# Patient Record
Sex: Male | Born: 1950
Health system: Southern US, Community
[De-identification: ages and names within clinical notes are randomized; demographics above are authoritative.]

## PROBLEM LIST (undated history)

## (undated) DIAGNOSIS — E7800 Pure hypercholesterolemia, unspecified: Secondary | ICD-10-CM

## (undated) DIAGNOSIS — E119 Type 2 diabetes mellitus without complications: Secondary | ICD-10-CM

## (undated) DIAGNOSIS — E1169 Type 2 diabetes mellitus with other specified complication: Secondary | ICD-10-CM

## (undated) DIAGNOSIS — E785 Hyperlipidemia, unspecified: Secondary | ICD-10-CM

## (undated) HISTORY — DX: Pure hypercholesterolemia, unspecified: E78.00

## (undated) HISTORY — DX: Type 2 diabetes mellitus with other specified complication: E11.69

## (undated) HISTORY — PX: EYE SURGERY: SHX253

## (undated) HISTORY — PX: OTHER SURGICAL HISTORY: SHX169

## (undated) HISTORY — PX: KNEE ARTHROSCOPY: SHX127

## (undated) HISTORY — DX: Type 2 diabetes mellitus with other specified complication: E78.5

---

## 2007-06-04 ENCOUNTER — Ambulatory Visit: Payer: Self-pay | Admitting: Specialist

## 2007-07-08 ENCOUNTER — Ambulatory Visit: Payer: Self-pay | Admitting: Specialist

## 2009-12-28 ENCOUNTER — Ambulatory Visit: Payer: Self-pay

## 2009-12-31 ENCOUNTER — Ambulatory Visit: Payer: Self-pay | Admitting: Unknown Physician Specialty

## 2010-04-12 ENCOUNTER — Encounter (INDEPENDENT_AMBULATORY_CARE_PROVIDER_SITE_OTHER): Payer: Self-pay | Admitting: *Deleted

## 2010-05-06 ENCOUNTER — Encounter (INDEPENDENT_AMBULATORY_CARE_PROVIDER_SITE_OTHER): Payer: Self-pay | Admitting: *Deleted

## 2010-05-11 ENCOUNTER — Ambulatory Visit: Payer: Self-pay | Admitting: Gastroenterology

## 2010-05-17 ENCOUNTER — Ambulatory Visit: Payer: Self-pay | Admitting: Unknown Physician Specialty

## 2010-05-18 ENCOUNTER — Ambulatory Visit: Payer: Self-pay | Admitting: Gastroenterology

## 2010-05-20 HISTORY — PX: INTRAOCULAR LENS INSERTION: SHX110

## 2010-12-20 NOTE — Procedures (Signed)
Summary: Colonoscopy  Patient: Hunter Peters Note: All result statuses are Final unless otherwise noted.  Tests: (1) Colonoscopy (COL)   COL Colonoscopy           DONE     [facility]     [address]     [place], [place]  [address]           COLONOSCOPY PROCEDURE REPORT           PATIENT:  Hunter Peters, Hunter Peters  MR#:  [id number]     BIRTHDATE:  05/17/1951, 59 yrs. old  GENDER:  male     ENDOSCOPIST:  Vania Rea. Jarold Motto, MD, [facility]     REF. BY:     PROCEDURE DATE:  05/18/2010     PROCEDURE:  Average-risk screening colonoscopy     G0121     ASA CLASS:  Class I     INDICATIONS:  surveillance     MEDICATIONS:   Fentanyl 75 mcg IV, Versed 9 mg IV           DESCRIPTION OF PROCEDURE:   After the risks benefits and     alternatives of the procedure were thoroughly explained, informed     consent was obtained.  Digital rectal exam was performed and     revealed no abnormalities.   The Pentax Colonoscope [id number]     endoscope was introduced through the anus and advanced to the     cecum, which was identified by both the appendix and ileocecal     valve, without limitations.  The quality of the prep was     excellent, using MoviPrep.  The instrument was then slowly     withdrawn as the colon was fully examined.     <<PROCEDUREIMAGES>>           FINDINGS:  Mild diverticulosis was found in the sigmoid to     descending colon segments.  No polyps or cancers were seen.  This     was otherwise a normal examination of the colon. PERIANAL     PAPILLAE.SEE PICTURES.   Retroflexed views in the rectum revealed     hypertrophied anal papillae.    The scope was then withdrawn from     the patient and the procedure completed.           COMPLICATIONS:  None     ENDOSCOPIC IMPRESSION:     1) Mild diverticulosis in the sigmoid to descending colon     segments     2) No polyps or cancers     3) Otherwise normal examination     4) Hypertrophied anal papillae     RECOMMENDATIONS:     1) high fiber  diet     2) Continue current colorectal screening recommendations for     "routine risk" patients with a repeat colonoscopy in 10 years.     REPEAT EXAM:  No           ______________________________     Vania Rea. Jarold Motto, MD, Clement[facility] Graham           CC:  Vonita Moss, MD           n.     Rosalie DoctorMarland Kitchen   Vania Rea. Patterson at 05/18/2010 11:01 AM           Tish Men, [id number]  Note: An exclamation Braylan (!) indicates a result that was not dispersed into the flowsheet. Document Creation Date: 05/18/2010 11:01 AM _______________________________________________________________________  (1) Order  result status: Final Collection or observation date-time: 05/18/2010 10:54 Requested date-time:  Receipt date-time:  Reported date-time:  Referring Physician:   Ordering Physician: Sheryn Bison [phone]) Specimen Source:  Source: Launa Grill Order Number: [id number] Lab site:   Appended Document: Colonoscopy    Clinical Lists Changes  Observations: Added new observation of COLONNXTDUE: 04/2020 (05/18/2010 12:26)

## 2010-12-20 NOTE — Letter (Signed)
Summary: Previsit letter  [facility]  [address] [place], [place] [address]   Phone: [phone]  Fax: [phone]       04/12/2010 MRN: [id number]  [facility] [address] [place], [place]  [address]  Dear Mr. HOPKIN,  Welcome to the Gastroenterology Division at [facility].    You are scheduled to see a nurse for your pre-procedure visit on 05-11-10 at 1:30P.M. on the 3rd floor at [facility], [address]  We ask that you try to arrive at our office 15 minutes prior to your appointment time to allow for check-in.  Your nurse visit will consist of discussing your medical and surgical history, your immediate family medical history, and your medications.    Please bring a complete list of all your medications or, if you prefer, bring the medication bottles and we will list them.  We will need to be aware of both prescribed and over the counter drugs.  We will need to know exact dosage information as well.  If you are on blood thinners (Coumadin, Plavix, Aggrenox, Ticlid, etc.) please call our office today/prior to your appointment, as we need to consult with your physician about holding your medication.   Please be prepared to read and sign documents such as consent forms, a financial agreement, and acknowledgement forms.  If necessary, and with your consent, a friend or relative is welcome to sit-in on the nurse visit with you.  Please bring your insurance card so that we may make a copy of it.  If your insurance requires a referral to see a specialist, please bring your referral form from your primary care physician.  No co-pay is required for this nurse visit.     If you cannot keep your appointment, please call [phone] to cancel or reschedule prior to your appointment date.  This allows [place] the opportunity to schedule an appointment for another patient in need of care.    Thank you for choosing [facility] for your medical  needs.  We appreciate the opportunity to care for you.  Please visit [place] at our website  to learn more about our practice.                     Sincerely.                                                                                                                   The Gastroenterology Division

## 2010-12-20 NOTE — Letter (Signed)
Summary: [facility] Instructions  [facility]  [address] [place], [place] [address]   Phone: [phone]  Fax: [phone]       Hunter Peters    February 01, 1951    MRN: [id number]        Procedure Day Dorna Bloom:  [facility]  05/18/10     Arrival Time:  9:30AM     Procedure Time:  10:30AM     Location of Procedure:                    _X _  [facility] (4th Floor)                        PREPARATION FOR COLONOSCOPY WITH MOVIPREP   Starting 5 days prior to your procedure 05/13/10 do not eat nuts, seeds, popcorn, corn, beans, peas,  salads, or any raw vegetables.  Do not take any fiber supplements (e.g. Metamucil, Citrucel, and Benefiber).  THE DAY BEFORE YOUR PROCEDURE         DATE: 05/17/10  DAY: TUESDAY  1.  Drink clear liquids the entire day-NO SOLID FOOD  2.  Do not drink anything colored red or purple.  Avoid juices with pulp.  No orange juice.  3.  Drink at least 64 oz. (8 glasses) of fluid/clear liquids during the day to prevent dehydration and help the prep work efficiently.  CLEAR LIQUIDS INCLUDE: Water Jello Ice Popsicles Tea (sugar ok, no milk/cream) Powdered fruit flavored drinks Coffee (sugar ok, no milk/cream) Gatorade Juice: apple, white grape, white cranberry  Lemonade Clear bullion, consomm, broth Carbonated beverages (any kind) Strained chicken noodle soup Hard Candy                             4.  In the morning, mix first dose of MoviPrep solution:    Empty 1 Pouch A and 1 Pouch B into the disposable container    Add lukewarm drinking water to the top line of the container. Mix to dissolve    Refrigerate (mixed solution should be used within 24 hrs)  5.  Begin drinking the prep at 5:00 p.m. The MoviPrep container is divided by 4 marks.   Every 15 minutes drink the solution down to the next Zandyr (approximately 8 oz) until the full liter is complete.   6.  Follow completed prep with 16 oz of clear liquid of your choice  (Nothing red or purple).  Continue to drink clear liquids until bedtime.  7.  Before going to bed, mix second dose of MoviPrep solution:    Empty 1 Pouch A and 1 Pouch B into the disposable container    Add lukewarm drinking water to the top line of the container. Mix to dissolve    Refrigerate  THE DAY OF YOUR PROCEDURE      DATE: 05/18/10  DAY: WEDNESDAY  Beginning at 5:30AM (5 hours before procedure):         1. Every 15 minutes, drink the solution down to the next Gianno (approx 8 oz) until the full liter is complete.  2. Follow completed prep with 16 oz. of clear liquid of your choice.    3. You may drink clear liquids until 8:30AM (2 HOURS BEFORE PROCEDURE).   MEDICATION INSTRUCTIONS  Unless otherwise instructed, you should take regular prescription medications with a small sip of water   as early as possible the morning  of your procedure.        OTHER INSTRUCTIONS  You will need a responsible adult at least 58 years of age to accompany you and drive you home.   This person must remain in the waiting room during your procedure.  Wear loose fitting clothing that is easily removed.  Leave jewelry and other valuables at home.  However, you may wish to bring a book to read or  an iPod/MP3 player to listen to music as you wait for your procedure to start.  Remove all body piercing jewelry and leave at home.  Total time from sign-in until discharge is approximately 2-3 hours.  You should go home directly after your procedure and rest.  You can resume normal activities the  day after your procedure.  The day of your procedure you should not:   Drive   Make legal decisions   Operate machinery   Drink alcohol   Return to work  You will receive specific instructions about eating, activities and medications before you leave.    The above instructions have been reviewed and explained to me by  [name] RN  May 11, 2010 1:57 PM     I fully understand  and can verbalize these instructions _____________________________ Date _________

## 2010-12-20 NOTE — Miscellaneous (Signed)
Summary: [facility] Previsit/prep  Clinical Lists Changes  Medications: Added new medication of MOVIPREP 100 GM  SOLR (PEG-KCL-NACL-NASULF-NA ASC-C) As per prep instructions. - Signed Rx of MOVIPREP 100 GM  SOLR (PEG-KCL-NACL-NASULF-NA ASC-C) As per prep instructions.;  #1 x 0;  Signed;  Entered by: [name] RN;  Authorized by: Mardella Layman MD [facility];  Method used: Electronically to [facility] [address], [place], [place], [place]  [address], Ph: [phone], Fax: [phone] Allergies: Added new allergy or adverse reaction of SULFA Observations: Added new observation of NKA: F (05/11/2010 13:09)    Prescriptions: MOVIPREP 100 GM  SOLR (PEG-KCL-NACL-NASULF-NA ASC-C) As per prep instructions.  #1 x 0   Entered by:   [name] RN   Authorized by:   Mardella Layman MD [facility]   Signed by:   [name] RN on 05/11/2010   Method used:   Electronically to        [facility]* (retail)       [address]       [place], [place]  [address]       Ph: [phone]       Fax: [phone]   RxID:   [id number]

## 2011-02-02 ENCOUNTER — Ambulatory Visit: Payer: Self-pay | Admitting: Unknown Physician Specialty

## 2015-04-09 ENCOUNTER — Encounter: Payer: Self-pay | Admitting: Gastroenterology

## 2015-08-11 DIAGNOSIS — H16302 Unspecified interstitial keratitis, left eye: Secondary | ICD-10-CM | POA: Insufficient documentation

## 2016-03-30 ENCOUNTER — Ambulatory Visit (INDEPENDENT_AMBULATORY_CARE_PROVIDER_SITE_OTHER): Payer: BLUE CROSS/BLUE SHIELD | Admitting: Family Medicine

## 2016-03-30 ENCOUNTER — Encounter: Payer: Self-pay | Admitting: Family Medicine

## 2016-03-30 VITALS — BP 113/70 | HR 81 | Temp 98.0°F | Ht 69.6 in | Wt 238.0 lb

## 2016-03-30 DIAGNOSIS — E1169 Type 2 diabetes mellitus with other specified complication: Secondary | ICD-10-CM | POA: Insufficient documentation

## 2016-03-30 DIAGNOSIS — E119 Type 2 diabetes mellitus without complications: Secondary | ICD-10-CM | POA: Diagnosis not present

## 2016-03-30 DIAGNOSIS — Z0000 Encounter for general adult medical examination without abnormal findings: Secondary | ICD-10-CM | POA: Diagnosis not present

## 2016-03-30 LAB — BAYER DCA HB A1C WAIVED: HB A1C: 7.6 % — AB (ref <7.0)

## 2016-03-30 MED ORDER — BETAMETHASONE DIPROPIONATE 0.05 % EX CREA: TOPICAL_CREAM | Freq: Two times a day (BID) | CUTANEOUS | Status: DC

## 2016-03-30 NOTE — Assessment & Plan Note (Signed)
Discussed diabetes care and treatment diet exercise nutrition weight loss Discuss Will increase metformin from 500 twice a day 2000 twice a day discussed slow increase to avoid GI effects will recheck A1c in 3 months. If home glucose checking isn't showing glucose coming down we will add medication over the phone

## 2016-03-30 NOTE — Progress Notes (Signed)
   BP 113/70 mmHg  Pulse 81  Temp(Src) 98 F (36.7 C)  Ht 5' 9.6" (1.768 m)  Wt 238 lb (107.956 kg)  BMI 34.54 kg/m2  SpO2 99%   Subjective:    Patient ID: Hunter Peters, male    DOB: 04-Dec-1950, 65 y.o.   MRN: [id number]  HPI: Hunter Peters is a 65 y.o. male  Chief Complaint  Patient presents with  . check blood sugar  Patient diabetic is been doing well ran out of metformin several weeks ago on checking blood sugars mostly in the mid 100s noted low blood sugar spells or other issues is doing well. Has otherwise been taking metformin faithfully without side effects Relevant past medical, surgical, family and social history reviewed and updated as indicated. Interim medical history since our last visit reviewed. Allergies and medications reviewed and updated.  Review of Systems  Constitutional: Negative.   Respiratory: Negative.   Cardiovascular: Negative.     Per HPI unless specifically indicated above     Objective:    BP 113/70 mmHg  Pulse 81  Temp(Src) 98 F (36.7 C)  Ht 5' 9.6" (1.768 m)  Wt 238 lb (107.956 kg)  BMI 34.54 kg/m2  SpO2 99%  Wt Readings from Last 3 Encounters:  03/30/16 238 lb (107.956 kg)  07/13/14 239 lb (108.41 kg)    Physical Exam  Constitutional: He is oriented to person, place, and time. He appears well-developed and well-nourished. No distress.  HENT:  Head: Normocephalic and atraumatic.  Right Ear: Hearing normal.  Left Ear: Hearing normal.  Nose: Nose normal.  Eyes: Conjunctivae and lids are normal. Right eye exhibits no discharge. Left eye exhibits no discharge. No scleral icterus.  Cardiovascular: Normal rate, regular rhythm and normal heart sounds.   Pulmonary/Chest: Effort normal and breath sounds normal. No respiratory distress.  Musculoskeletal: Normal range of motion.  Neurological: He is alert and oriented to person, place, and time.  Skin: Skin is intact. No rash noted.  Psychiatric: He has a normal mood and affect. His  speech is normal and behavior is normal. Judgment and thought content normal. Cognition and memory are normal.    No results found for this or any previous visit.    Assessment & Plan:   Problem List Items Addressed This Visit      Endocrine   DM2 (diabetes mellitus, type 2) ([place])    Discussed diabetes care and treatment diet exercise nutrition weight loss Discuss Will increase metformin from 500 twice a day 2000 twice a day discussed slow increase to avoid GI effects will recheck A1c in 3 months. If home glucose checking isn't showing glucose coming down we will add medication over the phone      Relevant Orders   Bayer Heron Hb A1c Waived    Other Visit Diagnoses    Healthcare maintenance    -  Primary    Relevant Orders    Hepatitis C Antibody        Follow up plan: Return in about 3 months (around 06/30/2016) for A1c.

## 2016-03-31 LAB — HEPATITIS C ANTIBODY: Hep C Virus Ab: <0.1 s/co ratio (ref 0.0–0.9)

## 2016-04-03 ENCOUNTER — Other Ambulatory Visit: Payer: Self-pay | Admitting: Family Medicine

## 2016-04-03 ENCOUNTER — Encounter: Payer: Self-pay | Admitting: Family Medicine

## 2016-04-03 MED ORDER — METFORMIN HCL 500 MG PO TABS: 1000.0000 mg | ORAL_TABLET | Freq: Two times a day (BID) | ORAL | Status: DC

## 2016-04-24 ENCOUNTER — Other Ambulatory Visit: Payer: Self-pay | Admitting: Family Medicine

## 2016-04-24 MED ORDER — CIPROFLOXACIN HCL 500 MG PO TABS: 500.0000 mg | ORAL_TABLET | Freq: Two times a day (BID) | ORAL | Status: DC

## 2016-04-24 MED ORDER — METRONIDAZOLE 500 MG PO TABS: 500.0000 mg | ORAL_TABLET | Freq: Two times a day (BID) | ORAL | Status: DC

## 2016-05-23 ENCOUNTER — Telehealth: Payer: Self-pay | Admitting: Otolaryngology

## 2016-05-23 NOTE — Telephone Encounter (Signed)
Patient called complaining about facial pain and pressure, ear pressure, nasal obstruction and nasal drainage consistent with prior sinus infections. He is in [place] and will be flying home Friday in his personal plane. He is worried about flying with an infection, especially with his ear pressure. Called in Augmentin and Prednisone to a local pharmacy. This has worked well for him in the past. He is a patient of Dr. Reggy Eye.

## 2016-07-03 ENCOUNTER — Other Ambulatory Visit: Payer: Self-pay | Admitting: Family Medicine

## 2016-07-03 ENCOUNTER — Ambulatory Visit (INDEPENDENT_AMBULATORY_CARE_PROVIDER_SITE_OTHER): Payer: Self-pay | Admitting: Family Medicine

## 2016-07-03 ENCOUNTER — Other Ambulatory Visit: Payer: Self-pay

## 2016-07-03 VITALS — BP 97/64 | HR 76 | Ht 70.0 in | Wt 225.0 lb

## 2016-07-03 DIAGNOSIS — E119 Type 2 diabetes mellitus without complications: Secondary | ICD-10-CM

## 2016-07-03 LAB — BAYER DCA HB A1C WAIVED: HB A1C: 6.7 % (ref <7.0)

## 2016-07-03 LAB — HEMOGLOBIN A1C: HEMOGLOBIN A1C: 6.7

## 2016-07-03 MED ORDER — METFORMIN HCL 500 MG PO TABS: 1000.0000 mg | ORAL_TABLET | Freq: Two times a day (BID) | ORAL | 6 refills | Status: DC

## 2016-07-03 NOTE — Progress Notes (Signed)
   BP 97/64 (BP Location: Left Arm, Patient Position: Sitting, Cuff Size: Normal)   Pulse 76   Ht 5\' 10"  (1.778 m)   Wt 225 lb (102.1 kg)   BMI 32.28 kg/m    Subjective:    Patient ID: Hunter Peters, male    DOB: Apr 26, 1951, 65 y.o.   MRN: [id number]  HPI: Hunter Peters is a 65 y.o. male  Chief Complaint  Patient presents with  . Class III Flight Physical   As well controlled type 2 diabetes with no low blood sugar spells Diverticulitis has resolved and is well controlled Relevant past medical, surgical, family and social history reviewed and updated as indicated. Interim medical history since our last visit reviewed. Allergies and medications reviewed and updated.  Review of Systems  Constitutional: Negative.   HENT: Negative.   Eyes: Negative.   Respiratory: Negative.   Cardiovascular: Negative.   Gastrointestinal: Negative.   Endocrine: Negative.   Genitourinary: Negative.   Musculoskeletal: Negative.   Skin: Negative.   Allergic/Immunologic: Negative.   Neurological: Negative.   Hematological: Negative.   Psychiatric/Behavioral: Negative.     Per HPI unless specifically indicated above     Objective:    BP 97/64 (BP Location: Left Arm, Patient Position: Sitting, Cuff Size: Normal)   Pulse 76   Ht 5\' 10"  (1.778 m)   Wt 225 lb (102.1 kg)   BMI 32.28 kg/m   Wt Readings from Last 3 Encounters:  07/03/16 225 lb (102.1 kg)  03/30/16 238 lb (108 kg)  07/13/14 239 lb (108.4 kg)    Physical Exam  Constitutional: He is oriented to person, place, and time. He appears well-developed and well-nourished.  HENT:  Head: Normocephalic.  Right Ear: External ear normal.  Left Ear: External ear normal.  Nose: Nose normal.  Eyes: Conjunctivae and EOM are normal. Pupils are equal, round, and reactive to light.  Neck: Normal range of motion. Neck supple. No thyromegaly present.  Cardiovascular: Normal rate, regular rhythm, normal heart sounds and intact distal pulses.     Pulmonary/Chest: Effort normal and breath sounds normal.  Abdominal: Soft. Bowel sounds are normal. There is no splenomegaly or hepatomegaly.  Genitourinary: Penis normal.  Musculoskeletal: Normal range of motion.  Lymphadenopathy:    He has no cervical adenopathy.  Neurological: He is alert and oriented to person, place, and time. He has normal reflexes.  Skin: Skin is warm and dry.  Psychiatric: He has a normal mood and affect. His behavior is normal. Judgment and thought content normal.    Results for orders placed or performed in visit on 03/30/16  Hepatitis C Antibody  Result Value Ref Range   Hep C Virus Ab <0.1 0.0 - 0.9 s/co ratio  Bayer DCA Hb A1c Waived  Result Value Ref Range   Bayer DCA Hb A1c Waived 7.6 (H) <7.0 %      Assessment & Plan:   Problem List Items Addressed This Visit      Endocrine   DM2 (diabetes mellitus, type 2) ([place]) - Primary    The current medical regimen is effective;  continue present plan and medications.       Relevant Orders   Bayer DCA Hb A1c Waived    Other Visit Diagnoses   None.      Follow up plan: Return for Hemoglobin A1c.

## 2016-07-03 NOTE — Assessment & Plan Note (Signed)
The current medical regimen is effective;  continue present plan and medications.  

## 2017-06-21 ENCOUNTER — Telehealth: Payer: Self-pay | Admitting: Family Medicine

## 2017-06-21 NOTE — Telephone Encounter (Signed)
Please see below.

## 2017-06-21 NOTE — Telephone Encounter (Signed)
Patient stopped in due to having to get his [facility] paperwork done. Patient was informed this was the month he was due for another physical but wanted to check in with provider first. Patient also wanted to check with provider and see if he needed an appointment for his A1C check.  Paperwork patient wanted provider to look at is placed in provider mailbox.  Please Advise.  Thank you

## 2017-06-25 ENCOUNTER — Encounter: Payer: Self-pay | Admitting: Family Medicine

## 2017-06-25 ENCOUNTER — Ambulatory Visit (INDEPENDENT_AMBULATORY_CARE_PROVIDER_SITE_OTHER): Payer: BLUE CROSS/BLUE SHIELD | Admitting: Family Medicine

## 2017-06-25 VITALS — BP 128/76 | HR 76 | Temp 98.0°F | Ht 70.0 in | Wt 230.0 lb

## 2017-06-25 DIAGNOSIS — Z029 Encounter for administrative examinations, unspecified: Secondary | ICD-10-CM | POA: Diagnosis not present

## 2017-06-25 DIAGNOSIS — Z0289 Encounter for other administrative examinations: Secondary | ICD-10-CM

## 2017-06-25 MED ORDER — METFORMIN HCL 500 MG PO TABS: 1000.0000 mg | ORAL_TABLET | Freq: Two times a day (BID) | ORAL | 12 refills | Status: DC

## 2017-06-25 NOTE — Progress Notes (Signed)
   BP 128/76 (BP Location: Left Arm)   Pulse 76   Temp 98 F (36.7 C)   Ht 5\' 10"  (1.778 m)   Wt 230 lb (104.3 kg)   BMI 33.00 kg/m    Subjective:    Patient ID: Hunter Peters, male    DOB: 03/20/1951, 66 y.o.   MRN: [id number]  HPI: Hunter Peters is a 66 y.o. male  Diabetes follow-up patient with no diabetes concerns good blood sugar control no low blood sugar spells takes metformin without problems. Also takes FML 4 shingles suppression left eye does fine with that and no further ocular conditions. Has been doing home A1c testing with range 6.3 6.4 over the last several testing periods.  Relevant past medical, surgical, family and social history reviewed and updated as indicated. Interim medical history since our last visit reviewed. Allergies and medications reviewed and updated.  Review of Systems  Constitutional: Negative.   Respiratory: Negative.   Cardiovascular: Negative.     Per HPI unless specifically indicated above     Objective:    BP 128/76 (BP Location: Left Arm)   Pulse 76   Temp 98 F (36.7 C)   Ht 5\' 10"  (1.778 m)   Wt 230 lb (104.3 kg)   BMI 33.00 kg/m   Wt Readings from Last 3 Encounters:  06/25/17 230 lb (104.3 kg)  07/03/16 225 lb (102.1 kg)  03/30/16 238 lb (108 kg)    Physical Exam  Constitutional: He is oriented to person, place, and time. He appears well-developed and well-nourished.  HENT:  Head: Normocephalic and atraumatic.  Eyes: Conjunctivae and EOM are normal.  Neck: Normal range of motion.  Cardiovascular: Normal rate, regular rhythm and normal heart sounds.   Pulmonary/Chest: Effort normal and breath sounds normal.  Musculoskeletal: Normal range of motion.  Neurological: He is alert and oriented to person, place, and time.  Skin: No erythema.  Psychiatric: He has a normal mood and affect. His behavior is normal. Judgment and thought content normal.    Results for orders placed or performed in visit on 07/03/16  Hemoglobin A1c   Result Value Ref Range   Hemoglobin A1C 6.7       Assessment & Plan:   Problem List Items Addressed This Visit    None    Visit Diagnoses    Encounter for Dispensing optician) examination    -  Primary   Relevant Orders   Bayer DCA Hb A1c Waived       Follow up plan: Return in about 6 months (around 12/26/2017) for Hemoglobin A1c, BMP.

## 2017-06-25 NOTE — Telephone Encounter (Signed)
Patient came in for appointment 06/25/2017.

## 2017-06-26 LAB — BAYER DCA HB A1C WAIVED: HB A1C (BAYER DCA - WAIVED): 6.5 % (ref <7.0)

## 2018-04-30 DIAGNOSIS — L570 Actinic keratosis: Secondary | ICD-10-CM | POA: Diagnosis not present

## 2018-04-30 DIAGNOSIS — D235 Other benign neoplasm of skin of trunk: Secondary | ICD-10-CM | POA: Diagnosis not present

## 2018-04-30 DIAGNOSIS — X32XXXA Exposure to sunlight, initial encounter: Secondary | ICD-10-CM | POA: Diagnosis not present

## 2018-04-30 DIAGNOSIS — L821 Other seborrheic keratosis: Secondary | ICD-10-CM | POA: Diagnosis not present

## 2018-05-03 DIAGNOSIS — R81 Glycosuria: Secondary | ICD-10-CM | POA: Diagnosis not present

## 2018-05-03 DIAGNOSIS — R351 Nocturia: Secondary | ICD-10-CM | POA: Diagnosis not present

## 2018-05-03 DIAGNOSIS — N452 Orchitis: Secondary | ICD-10-CM | POA: Diagnosis not present

## 2018-05-07 DIAGNOSIS — N452 Orchitis: Secondary | ICD-10-CM | POA: Diagnosis not present

## 2018-05-07 DIAGNOSIS — N503 Cyst of epididymis: Secondary | ICD-10-CM | POA: Diagnosis not present

## 2018-05-16 DIAGNOSIS — E113393 Type 2 diabetes mellitus with moderate nonproliferative diabetic retinopathy without macular edema, bilateral: Secondary | ICD-10-CM | POA: Diagnosis not present

## 2018-06-12 ENCOUNTER — Ambulatory Visit: Payer: BLUE CROSS/BLUE SHIELD | Admitting: Family Medicine

## 2018-06-12 ENCOUNTER — Encounter: Payer: Self-pay | Admitting: Family Medicine

## 2018-06-12 ENCOUNTER — Other Ambulatory Visit: Payer: Self-pay | Admitting: Family Medicine

## 2018-06-12 VITALS — BP 107/74 | HR 73 | Ht 69.5 in | Wt 223.4 lb

## 2018-06-12 DIAGNOSIS — E119 Type 2 diabetes mellitus without complications: Secondary | ICD-10-CM

## 2018-06-12 DIAGNOSIS — Z0289 Encounter for other administrative examinations: Secondary | ICD-10-CM

## 2018-06-12 DIAGNOSIS — Z029 Encounter for administrative examinations, unspecified: Secondary | ICD-10-CM

## 2018-06-12 LAB — BAYER DCA HB A1C WAIVED: HB A1C: 6.4 % (ref <7.0)

## 2018-06-12 MED ORDER — METFORMIN HCL 500 MG PO TABS: 1000.0000 mg | ORAL_TABLET | Freq: Two times a day (BID) | ORAL | 12 refills | Status: DC

## 2018-06-12 NOTE — Progress Notes (Signed)
   There were no vitals taken for this visit.   Subjective:    Patient ID: Hunter Peters, male    DOB: 06/22/1951, 67 y.o.   MRN: [id number]  HPI: Hunter Peters is a 67 y.o. male  FAA Class 3 exam  Diabetes doing well no low blood sugar spells no issues or concerns takes metformin without problems checks hemoglobin A1c at home and averages 6.8.  Relevant past medical, surgical, family and social history reviewed and updated as indicated. Interim medical history since our last visit reviewed. Allergies and medications reviewed and updated.  Review of Systems  Constitutional: Negative.   HENT: Negative.   Eyes: Negative.   Respiratory: Negative.   Cardiovascular: Negative.   Gastrointestinal: Negative.   Endocrine: Negative.   Genitourinary: Negative.   Musculoskeletal: Negative.   Skin: Negative.   Allergic/Immunologic: Negative.   Neurological: Negative.   Hematological: Negative.   Psychiatric/Behavioral: Negative.     Per HPI unless specifically indicated above     Objective:    There were no vitals taken for this visit.  Wt Readings from Last 3 Encounters:  06/25/17 230 lb (104.3 kg)  07/03/16 225 lb (102.1 kg)  03/30/16 238 lb (108 kg)    Physical Exam  Constitutional: He is oriented to person, place, and time. He appears well-developed and well-nourished.  HENT:  Head: Normocephalic.  Right Ear: External ear normal.  Left Ear: External ear normal.  Nose: Nose normal.  Eyes: Pupils are equal, round, and reactive to light. Conjunctivae and EOM are normal.  Neck: Normal range of motion. Neck supple. No thyromegaly present.  Cardiovascular: Normal rate, regular rhythm, normal heart sounds and intact distal pulses.  Pulmonary/Chest: Effort normal and breath sounds normal.  Abdominal: Soft. Bowel sounds are normal. There is no splenomegaly or hepatomegaly.  Genitourinary: Penis normal.  Musculoskeletal: Normal range of motion.  Lymphadenopathy:    He has no  cervical adenopathy.  Neurological: He is alert and oriented to person, place, and time. He has normal reflexes.  Skin: Skin is warm and dry.  Psychiatric: He has a normal mood and affect. His behavior is normal. Judgment and thought content normal.    Results for orders placed or performed in visit on 06/25/17  Bayer DCA Hb A1c Waived  Result Value Ref Range   HB A1C (BAYER DCA - WAIVED) 6.5 <7.0 %      Assessment & Plan:   Problem List Items Addressed This Visit      Endocrine   DM2 (diabetes mellitus, type 2) ([place]) - Primary    The current medical regimen is effective;  continue present plan and medications.       Relevant Orders   Bayer DCA Hb A1c Waived    Other Visit Diagnoses    Encounter for [employer]) examination           Follow up plan: Return in about 1 year (around 06/13/2019).

## 2018-06-12 NOTE — Assessment & Plan Note (Signed)
The current medical regimen is effective;  continue present plan and medications.  

## 2018-09-02 DIAGNOSIS — Z23 Encounter for immunization: Secondary | ICD-10-CM | POA: Diagnosis not present

## 2019-01-23 DIAGNOSIS — M179 Osteoarthritis of knee, unspecified: Secondary | ICD-10-CM

## 2019-01-23 DIAGNOSIS — M1711 Unilateral primary osteoarthritis, right knee: Secondary | ICD-10-CM | POA: Diagnosis not present

## 2019-01-23 DIAGNOSIS — M1710 Unilateral primary osteoarthritis, unspecified knee: Secondary | ICD-10-CM

## 2019-01-23 HISTORY — DX: Unilateral primary osteoarthritis, unspecified knee: M17.10

## 2019-01-23 HISTORY — DX: Osteoarthritis of knee, unspecified: M17.9

## 2019-04-28 ENCOUNTER — Other Ambulatory Visit: Payer: Self-pay | Admitting: Family Medicine

## 2019-04-28 ENCOUNTER — Other Ambulatory Visit: Payer: Self-pay

## 2019-04-28 ENCOUNTER — Encounter: Payer: Self-pay | Admitting: Family Medicine

## 2019-04-28 ENCOUNTER — Ambulatory Visit (INDEPENDENT_AMBULATORY_CARE_PROVIDER_SITE_OTHER): Payer: Medicare Other | Admitting: Family Medicine

## 2019-04-28 ENCOUNTER — Ambulatory Visit: Payer: Self-pay | Admitting: *Deleted

## 2019-04-28 DIAGNOSIS — E119 Type 2 diabetes mellitus without complications: Secondary | ICD-10-CM

## 2019-04-28 DIAGNOSIS — K5792 Diverticulitis of intestine, part unspecified, without perforation or abscess without bleeding: Secondary | ICD-10-CM | POA: Diagnosis not present

## 2019-04-28 MED ORDER — CIPROFLOXACIN HCL 500 MG PO TABS: 500.0000 mg | ORAL_TABLET | Freq: Two times a day (BID) | ORAL | 0 refills | Status: DC

## 2019-04-28 MED ORDER — METRONIDAZOLE 500 MG PO TABS: 500.0000 mg | ORAL_TABLET | Freq: Two times a day (BID) | ORAL | 0 refills | Status: DC

## 2019-04-28 NOTE — Assessment & Plan Note (Signed)
We will follow-up after diverticulitis treatment

## 2019-04-28 NOTE — Telephone Encounter (Signed)
[facility] called and spoke to Amy, [facility] about the refill requested. I advised it was sent on 06/13/19 #120/12 refills and he should have refills available. She says that he likes to get them 3 months at a time and the last refill was picked up today, so they sent a request for the next time.

## 2019-04-28 NOTE — Assessment & Plan Note (Signed)
Discussed diverticulitis care and treatment use of antibiotics and cautions.  Discussed worsening symptoms and things to watch for with going to the emergency room. Discussed complications with surgical intervention. Discussed need for colonoscopy after diverticulitis settles down.

## 2019-04-28 NOTE — Telephone Encounter (Signed)
Pt called with complaints LLQ sharp abdominal pain radiating across his lower abdomen, gurgly bowel, sounds, and feeling flushed;this started pm 04/27/2019; he had this pain before due to diverticulitis; the pt says that he is very compacted and has small bowel movements LBM 03/28/2019; the pt says that the area is tender to touch; recommendations made per nurse triage protocol; he normally sees Dr Elta [place] Jeananne Rama; pt transferred to [facility] for scheduling; will ruote to office for notification.       Reason for Disposition . [1] MILD pain (e.g., does not interfere with normal activities) AND [2] pain comes and goes (cramps) [3] present > 48 hours  Answer Assessment - Initial Assessment Questions 1. LOCATION: "Where does it hurt?"      Lower left abdomen 2. RADIATION: "Does the pain shoot anywhere else?" (e.g., chest, back)    Across entire lower abdomen  3. ONSET: "When did the pain begin?" (Minutes, hours or days ago)      04/28/2019 4. SUDDEN: "Gradual or sudden onset?"     grandual 5. PATTERN "Does the pain come and go, or is it constant?"    - If constant: "Is it getting better, staying the same, or worsening?"      (Note: Constant means the pain never goes away completely; most serious pain is constant and it progresses)     - If intermittent: "How long does it last?" "Do you have pain now?"     (Note: Intermittent means the pain goes away completely between bouts)     intermittent 6. SEVERITY: "How bad is the pain?"  (e.g., Scale 1-10; mild, moderate, or severe)    - MILD (1-3): doesn't interfere with normal activities, abdomen soft and not tender to touch     - MODERATE (4-7): interferes with normal activities or awakens from sleep, tender to touch     - SEVERE (8-10): excruciating pain, doubled over, unable to do any normal activities       Rated 2-3 out of 10 7. RECURRENT SYMPTOM: "Have you ever had this type of abdominal pain before?" If so, ask: "When was the last time?" and "What happened  that time?"      Yes with diverticulitis 8. CAUSE: "What do you think is causing the abdominal pain?"     diverticulitys 9. RELIEVING/AGGRAVATING FACTORS: "What makes it better or worse?" (e.g., movement, antacids, bowel movement)   no 10. OTHER SYMPTOMS: "Has there been any vomiting, diarrhea, constipation, or urine problems?"      constipation  Protocols used: ABDOMINAL PAIN - MALE-A-AH

## 2019-04-28 NOTE — Progress Notes (Signed)
   There were no vitals taken for this visit.   Subjective:    Patient ID: Hunter Peters, male    DOB: Mar 25, 1951, 68 y.o.   MRN: [id number]  HPI: Hunter Peters is a 68 y.o. male  abd pain  Telemedicine using audio/video telecommunications for a synchronous communication visit. Today's visit due to COVID-19 isolation precautions I connected with and verified that I am speaking with the correct person using two identifiers.   I discussed the limitations, risks, security and privacy concerns of performing an evaluation and management service by telecommunication and the availability of in person appointments. I also discussed with the patient that there may be a patient responsible charge related to this service. The patient expressed understanding and agreed to proceed. The patient's location is home. I am at home.   Patient with diverticulitis symptoms had diverticulitis in the past and is feeling abdominal pain left lower quadrant which is responded to antibiotics previously.  Patient is nauseous but is able to drink clear liquids. Patient is also due colonoscopy and wants to have one this summer. No other complaints are elevated blood sugar symptoms.  Relevant past medical, surgical, family and social history reviewed and updated as indicated. Interim medical history since our last visit reviewed. Allergies and medications reviewed and updated.  Review of Systems  Constitutional: Positive for chills and fatigue. Negative for fever.  Respiratory: Negative.   Cardiovascular: Negative.     Per HPI unless specifically indicated above     Objective:    There were no vitals taken for this visit.  Wt Readings from Last 3 Encounters:  06/12/18 223 lb 6.4 oz (101.3 kg)  06/25/17 230 lb (104.3 kg)  07/03/16 225 lb (102.1 kg)    Physical Exam  Results for orders placed or performed in visit on 06/12/18  Bayer DCA Hb A1c Waived  Result Value Ref Range   HB A1C (BAYER DCA - WAIVED)  6.4 <7.0 %      Assessment & Plan:   Problem List Items Addressed This Visit      Endocrine   DM2 (diabetes mellitus, type 2) (HCC)    We will follow-up after diverticulitis treatment        Other   Diverticulitis    Discussed diverticulitis care and treatment use of antibiotics and cautions.  Discussed worsening symptoms and things to watch for with going to the emergency room. Discussed complications with surgical intervention. Discussed need for colonoscopy after diverticulitis settles down.          I discussed the assessment and treatment plan with the patient. The patient was provided an opportunity to ask questions and all were answered. The patient agreed with the plan and demonstrated an understanding of the instructions.   The patient was advised to call back or seek an in-person evaluation if the symptoms worsen or if the condition fails to improve as anticipated.   I provided 21+ minutes of time during this encounter. Follow up plan: Return in about 4 weeks (around 05/26/2019) for Hemoglobin A1c.

## 2019-04-29 DIAGNOSIS — L570 Actinic keratosis: Secondary | ICD-10-CM | POA: Diagnosis not present

## 2019-04-29 DIAGNOSIS — L821 Other seborrheic keratosis: Secondary | ICD-10-CM | POA: Diagnosis not present

## 2019-04-29 DIAGNOSIS — B360 Pityriasis versicolor: Secondary | ICD-10-CM | POA: Diagnosis not present

## 2019-04-29 DIAGNOSIS — L538 Other specified erythematous conditions: Secondary | ICD-10-CM | POA: Diagnosis not present

## 2019-04-29 DIAGNOSIS — L820 Inflamed seborrheic keratosis: Secondary | ICD-10-CM | POA: Diagnosis not present

## 2019-04-29 DIAGNOSIS — X32XXXA Exposure to sunlight, initial encounter: Secondary | ICD-10-CM | POA: Diagnosis not present

## 2019-06-16 DIAGNOSIS — E113393 Type 2 diabetes mellitus with moderate nonproliferative diabetic retinopathy without macular edema, bilateral: Secondary | ICD-10-CM | POA: Diagnosis not present

## 2019-08-04 ENCOUNTER — Other Ambulatory Visit: Payer: Self-pay | Admitting: Family Medicine

## 2019-08-06 ENCOUNTER — Ambulatory Visit (INDEPENDENT_AMBULATORY_CARE_PROVIDER_SITE_OTHER): Payer: Medicare Other | Admitting: Family Medicine

## 2019-08-06 ENCOUNTER — Other Ambulatory Visit: Payer: Self-pay | Admitting: Family Medicine

## 2019-08-06 ENCOUNTER — Encounter: Payer: Self-pay | Admitting: Family Medicine

## 2019-08-06 ENCOUNTER — Other Ambulatory Visit: Payer: Self-pay

## 2019-08-06 DIAGNOSIS — E1169 Type 2 diabetes mellitus with other specified complication: Secondary | ICD-10-CM

## 2019-08-06 DIAGNOSIS — Z0000 Encounter for general adult medical examination without abnormal findings: Secondary | ICD-10-CM | POA: Diagnosis not present

## 2019-08-06 DIAGNOSIS — Z7189 Other specified counseling: Secondary | ICD-10-CM | POA: Insufficient documentation

## 2019-08-06 DIAGNOSIS — R079 Chest pain, unspecified: Secondary | ICD-10-CM

## 2019-08-06 DIAGNOSIS — K5792 Diverticulitis of intestine, part unspecified, without perforation or abscess without bleeding: Secondary | ICD-10-CM | POA: Diagnosis not present

## 2019-08-06 DIAGNOSIS — Z1211 Encounter for screening for malignant neoplasm of colon: Secondary | ICD-10-CM | POA: Insufficient documentation

## 2019-08-06 DIAGNOSIS — E7800 Pure hypercholesterolemia, unspecified: Secondary | ICD-10-CM | POA: Diagnosis not present

## 2019-08-06 MED ORDER — METRONIDAZOLE 500 MG PO TABS: 500.0000 mg | ORAL_TABLET | Freq: Two times a day (BID) | ORAL | 1 refills | Status: DC

## 2019-08-06 MED ORDER — CIPROFLOXACIN HCL 500 MG PO TABS: 500.0000 mg | ORAL_TABLET | Freq: Two times a day (BID) | ORAL | 1 refills | Status: DC

## 2019-08-06 MED ORDER — METFORMIN HCL 500 MG PO TABS: 1000.0000 mg | ORAL_TABLET | Freq: Two times a day (BID) | ORAL | 4 refills | Status: DC

## 2019-08-06 MED ORDER — ROSUVASTATIN CALCIUM 10 MG PO TABS: 10.0000 mg | ORAL_TABLET | Freq: Every day | ORAL | 4 refills | Status: DC

## 2019-08-06 NOTE — Assessment & Plan Note (Signed)
Good control with metformin and no issues of hypoglycemia. We will check hemoglobin A1c but good A1c readings on home testing also.

## 2019-08-06 NOTE — Assessment & Plan Note (Signed)
A voluntary discussion about advanced care planning including explanation and discussion of advanced directives was extentively discussed with the patient.  Explained about the healthcare proxy and living will was reviewed and packet with forms with expiration of how to fill them out was given.  Time spent: Encounter 16+ min individuals present: Patient 

## 2019-08-06 NOTE — Assessment & Plan Note (Signed)
Patient with positive family history and risk factors of diabetes will refer to cardiology to further evaluate

## 2019-08-06 NOTE — Assessment & Plan Note (Signed)
Patient with diabetes will start Crestor 10 mg 1 a day

## 2019-08-06 NOTE — Progress Notes (Signed)
There were no vitals taken for this visit.   Subjective:    Patient ID: Hunter Peters, male    DOB: 1951-03-07, 68 y.o.   MRN: [id number]  HPI: Hunter Peters is a 68 y.o. male  Med check Discussed with patient diabetes doing well doing hemoglobin A1c checks at home and last done last month was 6.4.  Patient with no other complaints doing well had episode of diverticulitis this June and recovered well from that with metformin and metronidazole.  Patient travels extensively and will give prescription to go for PRN usage patient indicates he understands when and how to use these medications and proper precautions. Diabetes with no hypoglycemic spells gave refill on metformin. Followed by Dr. for steroid prescription he uses about every other day with normal pressures in his eye.  Takes this for history of shingles. Patient had a new outbreak and prescription was just called in.  With resolution of patient's symptoms. Reviewed cholesterol and statins for diabetes patient indicates he understands and will go ahead and start taking Crestor. Reviewed cardiac status with positive family history and diabetes as a risk factor will refer to cardiology to further evaluate.  Relevant past medical, surgical, family and social history reviewed and updated as indicated. Interim medical history since our last visit reviewed. Allergies and medications reviewed and updated.  Review of Systems  Constitutional: Negative.   HENT: Negative.   Eyes: Negative.   Respiratory: Negative.   Cardiovascular: Negative.   Gastrointestinal: Negative.   Endocrine: Negative.   Genitourinary: Negative.   Musculoskeletal: Negative.   Skin: Negative.   Allergic/Immunologic: Negative.   Neurological: Negative.   Hematological: Negative.   Psychiatric/Behavioral: Negative.     Per HPI unless specifically indicated above     Objective:    There were no vitals taken for this visit.  Wt Readings from Last 3  Encounters:  06/12/18 223 lb 6.4 oz (101.3 kg)  06/25/17 230 lb (104.3 kg)  07/03/16 225 lb (102.1 kg)    Physical Exam  Results for orders placed or performed in visit on 06/12/18  Bayer DCA Hb A1c Waived  Result Value Ref Range   HB A1C (BAYER DCA - WAIVED) 6.4 <7.0 %      Assessment & Plan:   Problem List Items Addressed This Visit      Endocrine   Diabetes mellitus associated with hormonal etiology ([place]) - Primary    Good control with metformin and no issues of hypoglycemia. We will check hemoglobin A1c but good A1c readings on home testing also.      Relevant Medications   rosuvastatin (CRESTOR) 10 MG tablet   Other Relevant Orders   Ambulatory referral to Cardiology   CBC with Differential/Platelet   Bayer DCA Hb A1c Waived     Other   Diverticulitis    Has occasional spells most recent this summer will give prescription to hold because of his travel.  Patient not due colonoscopy until next summer.      Relevant Orders   CBC with Differential/Platelet   Hypercholesterolemia    Patient with diabetes will start Crestor 10 mg 1 a day      Relevant Medications   rosuvastatin (CRESTOR) 10 MG tablet   Other Relevant Orders   CBC with Differential/Platelet   Chest pain    Patient with positive family history and risk factors of diabetes will refer to cardiology to further evaluate      Relevant Orders   Ambulatory referral  to Cardiology   CBC with Differential/Platelet   Advance care planning    A voluntary discussion about advanced care planning including explanation and discussion of advanced directives was extentively discussed with the patient.  Explained about the healthcare proxy and living will was reviewed and packet with forms with expiration of how to fill them out was given.  Time spent: Encounter 16+ min individuals present: Patient       Other Visit Diagnoses    PE (physical exam), annual       Relevant Orders   Comprehensive metabolic panel    Lipid panel   CBC with Differential/Platelet   TSH   Urinalysis, Routine w reflex microscopic   PSA       Telemedicine using audio/video telecommunications for a synchronous communication visit. Today's visit due to COVID-19 isolation precautions I connected with and verified that I am speaking with the correct person using two identifiers.   I discussed the limitations, risks, security and privacy concerns of performing an evaluation and management service by telecommunication and the availability of in person appointments. I also discussed with the patient that there may be a patient responsible charge related to this service. The patient expressed understanding and agreed to proceed. The patient's location is home. I am at home.   I discussed the assessment and treatment plan with the patient. The patient was provided an opportunity to ask questions and all were answered. The patient agreed with the plan and demonstrated an understanding of the instructions.   The patient was advised to call back or seek an in-person evaluation if the symptoms worsen or if the condition fails to improve as anticipated.   I provided 21+ minutes of time during this encounter. Follow up plan: Return for Physical Exam.

## 2019-08-06 NOTE — Assessment & Plan Note (Signed)
Has occasional spells most recent this summer will give prescription to hold because of his travel.  Patient not due colonoscopy until next summer.

## 2019-08-07 ENCOUNTER — Ambulatory Visit (INDEPENDENT_AMBULATORY_CARE_PROVIDER_SITE_OTHER): Payer: Medicare Other | Admitting: Unknown Physician Specialty

## 2019-08-07 ENCOUNTER — Other Ambulatory Visit: Payer: Self-pay

## 2019-08-07 ENCOUNTER — Encounter: Payer: Self-pay | Admitting: Unknown Physician Specialty

## 2019-08-07 ENCOUNTER — Other Ambulatory Visit: Payer: Medicare Other

## 2019-08-07 VITALS — BP 127/75 | HR 82 | Temp 98.3°F | Ht 69.5 in | Wt 225.4 lb

## 2019-08-07 DIAGNOSIS — E7800 Pure hypercholesterolemia, unspecified: Secondary | ICD-10-CM | POA: Diagnosis not present

## 2019-08-07 DIAGNOSIS — K5792 Diverticulitis of intestine, part unspecified, without perforation or abscess without bleeding: Secondary | ICD-10-CM

## 2019-08-07 DIAGNOSIS — E1169 Type 2 diabetes mellitus with other specified complication: Secondary | ICD-10-CM

## 2019-08-07 DIAGNOSIS — R079 Chest pain, unspecified: Secondary | ICD-10-CM | POA: Diagnosis not present

## 2019-08-07 DIAGNOSIS — Z0000 Encounter for general adult medical examination without abnormal findings: Secondary | ICD-10-CM

## 2019-08-07 DIAGNOSIS — Z125 Encounter for screening for malignant neoplasm of prostate: Secondary | ICD-10-CM | POA: Diagnosis not present

## 2019-08-07 DIAGNOSIS — Z7189 Other specified counseling: Secondary | ICD-10-CM | POA: Diagnosis not present

## 2019-08-07 LAB — URINALYSIS, ROUTINE W REFLEX MICROSCOPIC
Bilirubin, UA: NEGATIVE
Glucose, UA: NEGATIVE
Ketones, UA: NEGATIVE
Leukocytes,UA: NEGATIVE
Nitrite, UA: NEGATIVE
Protein,UA: NEGATIVE
RBC, UA: NEGATIVE
Specific Gravity, UA: 1.025 (ref 1.005–1.030)
Urobilinogen, Ur: 0.2 mg/dL (ref 0.2–1.0)
pH, UA: 5.5 (ref 5.0–7.5)

## 2019-08-07 LAB — BAYER DCA HB A1C WAIVED: HB A1C (BAYER DCA - WAIVED): 7.1 % — ABNORMAL HIGH (ref <7.0)

## 2019-08-07 NOTE — Assessment & Plan Note (Addendum)
No Hgb A1C but below 7 at home.  Continue present medications.  Microalbumin was 10

## 2019-08-07 NOTE — Assessment & Plan Note (Signed)
A voluntary discussion about advance care planning including the explanation and discussion of advance directives was extensively discussed  with the patient.  Explanation about the health care proxy and Living will was reviewed and packet with forms with explanation of how to fill them out was given.  During this discussion, the patient was able to identify a health care proxy as his wife and son as secondary and had done the paperwork.   Patient was offered a separate [place] visit for further assistance with forms.  Time spent:  5 minutes      Individuals pt

## 2019-08-07 NOTE — Progress Notes (Signed)
BP 127/75   Pulse 82   Temp 98.3 F (36.8 C) (Oral)   Ht 5' 9.5" (1.765 m)   Wt 225 lb 6.4 oz (102.2 kg)   SpO2 97%   BMI 32.81 kg/m    Subjective:    Patient ID: Arley Phenix, male    DOB: [birth year], 68 y.o.   MRN: [id number]  HPI: BILL WIED is a 68 y.o. male  Chief Complaint  Patient presents with  . Annual Exam   History reviewed. No pertinent past medical history.  Past Surgical History:  Procedure Laterality Date  . Eyelid Surgery Bilateral   . INTRAOCULAR LENS INSERTION Left 7/11  . KNEE ARTHROSCOPY Right    Diabetes: Using medications without difficulties No hypoglycemic episodes  No hyperglycemic episodes Feet problems: none  Blood Sugars averaging: 149-159 eye exam within last year Last Hgb A1C: 6.7-6.9 home Hgb A1C  Hypertension  Just started Crestor 10 mg/day Average home BPs Not    Using medication without problems or lightheadedness No chest pain with exertion or shortness of breath No Edema  Elevated Cholesterol Using medications without problems No Muscle aches  Diet: Tries to avoid sweets.   Exercise: Golf 3 days/week   Current Outpatient Medications on File Prior to Visit  Medication Sig Dispense Refill  . betamethasone dipropionate (DIPROLENE) 0.05 % cream Apply topically 2 (two) times daily. 30 g 0  . fluorometholone (FML) 0.1 % ophthalmic suspension 1 drop daily.  3  . metFORMIN (GLUCOPHAGE) 500 MG tablet Take 2 tablets (1,000 mg total) by mouth 2 (two) times daily with a meal. 360 tablet 4  . rosuvastatin (CRESTOR) 10 MG tablet Take 1 tablet (10 mg total) by mouth daily. 90 tablet 4  . ciprofloxacin (CIPRO) 500 MG tablet Take 1 tablet (500 mg total) by mouth 2 (two) times daily. (Patient not taking: Reported on 08/07/2019) 28 tablet 1  . metroNIDAZOLE (FLAGYL) 500 MG tablet Take 1 tablet (500 mg total) by mouth 2 (two) times daily. (Patient not taking: Reported on 08/07/2019) 28 tablet 1   No current facility-administered  medications on file prior to visit.      Relevant past medical, surgical, family and social history reviewed and updated as indicated. Interim medical history since our last visit reviewed. Allergies and medications reviewed and updated.  Review of Systems  Constitutional: Negative.   HENT: Negative.   Eyes: Negative.   Respiratory: Negative.   Cardiovascular: Negative.   Gastrointestinal: Negative.   Endocrine: Negative.   Genitourinary: Negative.   Skin: Negative.   Allergic/Immunologic: Negative.   Neurological: Negative.   Hematological: Negative.   Psychiatric/Behavioral: Negative.     Per HPI unless specifically indicated above     Objective:    BP 127/75   Pulse 82   Temp 98.3 F (36.8 C) (Oral)   Ht 5' 9.5" (1.765 m)   Wt 225 lb 6.4 oz (102.2 kg)   SpO2 97%   BMI 32.81 kg/m   Wt Readings from Last 3 Encounters:  08/07/19 225 lb 6.4 oz (102.2 kg)  06/12/18 223 lb 6.4 oz (101.3 kg)  06/25/17 230 lb (104.3 kg)    Physical Exam Constitutional:      Appearance: He is well-developed.  HENT:     Head: Normocephalic.     Right Ear: Tympanic membrane, ear canal and external ear normal.     Left Ear: Tympanic membrane, ear canal and external ear normal.     Mouth/Throat:  Pharynx: Uvula midline.  Eyes:     Pupils: Pupils are equal, round, and reactive to light.  Cardiovascular:     Rate and Rhythm: Normal rate and regular rhythm.     Heart sounds: Normal heart sounds. No murmur. No friction rub. No gallop.   Pulmonary:     Effort: Pulmonary effort is normal. No respiratory distress.     Breath sounds: Normal breath sounds.  Abdominal:     General: Bowel sounds are normal. There is no distension.     Palpations: Abdomen is soft.     Tenderness: There is no abdominal tenderness.  Genitourinary:    Prostate: Normal.  Musculoskeletal: Normal range of motion.  Skin:    General: Skin is warm and dry.  Neurological:     Mental Status: He is alert and  oriented to person, place, and time.     Deep Tendon Reflexes: Reflexes are normal and symmetric.  Psychiatric:        Behavior: Behavior normal.        Thought Content: Thought content normal.        Judgment: Judgment normal.    Results for orders placed or performed in visit on 06/12/18  Bayer DCA Hb A1c Waived  Result Value Ref Range   HB A1C (BAYER DCA - WAIVED) 6.4 <7.0 %      Assessment & Plan:   Problem List Items Addressed This Visit      Unprioritized   Advance care planning    A voluntary discussion about advance care planning including the explanation and discussion of advance directives was extensively discussed  with the patient.  Explanation about the health care proxy and Living will was reviewed and packet with forms with explanation of how to fill them out was given.  During this discussion, the patient was able to identify a health care proxy as his wife and son as secondary and had done the paperwork.   Patient was offered a separate [place] visit for further assistance with forms.  Time spent:  5 minutes      Individuals pt       Diabetes mellitus associated with hormonal etiology ([place]) - Primary    No Hgb A1C but below 7 at home.  Continue present medications.  Microalbumin was 10      Relevant Orders   Microalbumin, Urine Waived   Hypercholesterolemia    Just started Crestor today.  Answered questions       Other Visit Diagnoses    Annual physical exam       Well man with health maintenance items updated.  Refusing flu shot today.  Up to date on shingles.         Follow up plan: 6 months

## 2019-08-07 NOTE — Assessment & Plan Note (Signed)
Just started Crestor today.  Answered questions

## 2019-08-08 ENCOUNTER — Encounter: Payer: Self-pay | Admitting: Family Medicine

## 2019-08-08 LAB — CBC WITH DIFFERENTIAL/PLATELET
Basophils Absolute: 0 10*3/uL (ref 0.0–0.2)
Basos: 1 %
EOS (ABSOLUTE): 0.1 10*3/uL (ref 0.0–0.4)
Eos: 2 %
Hematocrit: 45 % (ref 37.5–51.0)
Hemoglobin: 15.3 g/dL (ref 13.0–17.7)
Immature Grans (Abs): 0.1 10*3/uL (ref 0.0–0.1)
Immature Granulocytes: 1 %
Lymphocytes Absolute: 2.4 10*3/uL (ref 0.7–3.1)
Lymphs: 27 %
MCH: 30.5 pg (ref 26.6–33.0)
MCHC: 34 g/dL (ref 31.5–35.7)
MCV: 90 fL (ref 79–97)
Monocytes Absolute: 0.6 10*3/uL (ref 0.1–0.9)
Monocytes: 6 %
Neutrophils Absolute: 5.7 10*3/uL (ref 1.4–7.0)
Neutrophils: 63 %
Platelets: 340 10*3/uL (ref 150–450)
RBC: 5.01 x10E6/uL (ref 4.14–5.80)
RDW: 13.4 % (ref 11.6–15.4)
WBC: 8.9 10*3/uL (ref 3.4–10.8)

## 2019-08-08 LAB — LIPID PANEL
Chol/HDL Ratio: 2.7 ratio (ref 0.0–5.0)
Cholesterol, Total: 159 mg/dL (ref 100–199)
HDL: 58 mg/dL (ref >39)
LDL Chol Calc (NIH): 78 mg/dL (ref 0–99)
Triglycerides: 131 mg/dL (ref 0–149)
VLDL Cholesterol Cal: 23 mg/dL (ref 5–40)

## 2019-08-08 LAB — COMPREHENSIVE METABOLIC PANEL
ALT: 16 IU/L (ref 0–44)
AST: 15 IU/L (ref 0–40)
Albumin/Globulin Ratio: 1.9 (ref 1.2–2.2)
Albumin: 4.7 g/dL (ref 3.8–4.8)
Alkaline Phosphatase: 44 IU/L (ref 39–117)
BUN/Creatinine Ratio: 22 (ref 10–24)
BUN: 21 mg/dL (ref 8–27)
Bilirubin Total: 1 mg/dL (ref 0.0–1.2)
CO2: 25 mmol/L (ref 20–29)
Calcium: 9.7 mg/dL (ref 8.6–10.2)
Chloride: 99 mmol/L (ref 96–106)
Creatinine, Ser: 0.94 mg/dL (ref 0.76–1.27)
GFR calc Af Amer: 97 mL/min/{1.73_m2} (ref >59)
GFR calc non Af Amer: 84 mL/min/{1.73_m2} (ref >59)
Globulin, Total: 2.5 g/dL (ref 1.5–4.5)
Glucose: 139 mg/dL — ABNORMAL HIGH (ref 65–99)
Potassium: 4.5 mmol/L (ref 3.5–5.2)
Sodium: 141 mmol/L (ref 134–144)
Total Protein: 7.2 g/dL (ref 6.0–8.5)

## 2019-08-08 LAB — MICROALBUMIN, URINE WAIVED
Creatinine, Urine Waived: 300 mg/dL (ref 10–300)
Microalb, Ur Waived: 10 mg/L (ref 0–19)
Microalb/Creat Ratio: <30 mg/g (ref <30)

## 2019-08-08 LAB — TSH: TSH: 1.93 u[IU]/mL (ref 0.450–4.500)

## 2019-08-08 LAB — PSA: Prostate Specific Ag, Serum: 0.7 ng/mL (ref 0.0–4.0)

## 2019-08-21 ENCOUNTER — Encounter: Payer: Medicare Other | Admitting: Unknown Physician Specialty

## 2019-08-25 NOTE — Progress Notes (Signed)
Cardiology Office Note  Date:  08/26/2019   ID:  Quantavis, Rivett 07/27/1951, MRN [id number]  PCP:  Guadalupe Maple, MD   Chief Complaint  Patient presents with  . Other    Referred by Dr. Jeananne Rama for chest pain. Meds reviewed verbally with patient.     HPI:  Mr. Hunter Peters is a 68 year old gentleman with past medical history of Diabetes Family history cardiac disease Hyperlipidemia nonsmoker Referred by Dr. Kelli Churn for chest pain, cardiac screening  Lab work discussed with him HBa1C 7.1, trending higher secondary to poor diet LDL 78, total chol 159  Golf 3-4 days a week Walks, 7K steps Denies any shortness of breath or chest pain on exertion  Interested in risk stratification given his risk factors of diabetes Possible family history  EKG personally reviewed by myself on todays visit Shows normal sinus rhythm rate 72 bpm no significant ST-T wave changes  PMH:   Diabetes type 2  PSH:    Past Surgical History:  Procedure Laterality Date  . Eyelid Surgery Bilateral   . INTRAOCULAR LENS INSERTION Left 7/11  . KNEE ARTHROSCOPY Right     Current Outpatient Medications  Medication Sig Dispense Refill  . betamethasone dipropionate (DIPROLENE) 0.05 % cream Apply topically 2 (two) times daily. 30 g 0  . fluorometholone (FML) 0.1 % ophthalmic suspension 1 drop daily.  3  . metFORMIN (GLUCOPHAGE) 500 MG tablet Take 2 tablets (1,000 mg total) by mouth 2 (two) times daily with a meal. 360 tablet 4  . rosuvastatin (CRESTOR) 10 MG tablet Take 1 tablet (10 mg total) by mouth daily. 90 tablet 4  . losartan (COZAAR) 25 MG tablet Take 1 tablet (25 mg total) by mouth daily. 90 tablet 3   No current facility-administered medications for this visit.      Allergies:   Sulfonamide derivatives   Social History:  The patient  reports that he has never smoked. He has never used smokeless tobacco. He reports current alcohol use. He reports that he does not use drugs.   Family  History:   family history includes Alzheimer's disease in his mother; Hypertension in his father.    Review of Systems: Review of Systems  Constitutional: Negative.   HENT: Negative.   Respiratory: Negative.   Cardiovascular: Negative.   Gastrointestinal: Negative.   Musculoskeletal: Negative.   Neurological: Negative.   Psychiatric/Behavioral: Negative.   All other systems reviewed and are negative.    PHYSICAL EXAM: VS:  BP 120/78 (BP Location: Right Arm, Patient Position: Sitting, Cuff Size: Normal)   Pulse 72   Ht 5' 9.5" (1.765 m)   Wt 229 lb (103.9 kg)   BMI 33.33 kg/m  , BMI Body mass index is 33.33 kg/m. GEN: Well nourished, well developed, in no acute distress HEENT: normal Neck: no JVD, carotid bruits, or masses Cardiac: RRR; no murmurs, rubs, or gallops,no edema  Respiratory:  clear to auscultation bilaterally, normal work of breathing GI: soft, nontender, nondistended, + BS MS: no deformity or atrophy Skin: warm and dry, no rash Neuro:  Strength and sensation are intact Psych: euthymic mood, full affect    Recent Labs: 08/07/2019: ALT 16; BUN 21; Creatinine, Ser 0.94; Hemoglobin 15.3; Platelets 340; Potassium 4.5; Sodium 141; TSH 1.930    Lipid Panel Lab Results  Component Value Date   CHOL 159 08/07/2019   HDL 58 08/07/2019   LDLCALC 78 08/07/2019   TRIG 131 08/07/2019      Wt Readings from  Last 3 Encounters:  08/26/19 229 lb (103.9 kg)  08/07/19 225 lb 6.4 oz (102.2 kg)  06/12/18 223 lb 6.4 oz (101.3 kg)       ASSESSMENT AND PLAN:  Problem List Items Addressed This Visit      Cardiology Problems   Hypercholesterolemia   Relevant Medications   losartan (COZAAR) 25 MG tablet   Other Relevant Orders   CT CARDIAC SCORING     Other   Diabetes mellitus associated with hormonal etiology ([place]) - Primary   Relevant Medications   losartan (COZAAR) 25 MG tablet   Other Relevant Orders   EKG 12-Lead   Chest pain   Relevant Orders   EKG  12-Lead   CT CARDIAC SCORING    Other Visit Diagnoses    Family history of early CAD       Relevant Orders   CT CARDIAC SCORING     Denies having significant cardiac symptoms In terms of his risk stratification, we have recommended CT coronary calcium scoring Scan discussed with him in detail Order placed, he is willing to proceed  Diabetes type 2 Dietary guide discussed with him On metformin Could consider extended release metformin given some GI issues and he would like to take medications once a day We will start losartan 25 mg daily for renal protection  Long discussion concerning risk stratification Non-smoker, he does have diabetes, cholesterol is actually reasonable before statin  Disposition:   F/U as needed   Total encounter time more than 45 minutes  Greater than 50% was spent in counseling and coordination of care with the patient    Signed, [name], M.D., Ph.D. [place], [place]

## 2019-08-26 ENCOUNTER — Other Ambulatory Visit: Payer: Self-pay

## 2019-08-26 ENCOUNTER — Ambulatory Visit (INDEPENDENT_AMBULATORY_CARE_PROVIDER_SITE_OTHER): Payer: Medicare Other | Admitting: Cardiovascular Disease

## 2019-08-26 ENCOUNTER — Encounter: Payer: Self-pay | Admitting: Cardiovascular Disease

## 2019-08-26 VITALS — BP 120/78 | HR 72 | Ht 69.5 in | Wt 229.0 lb

## 2019-08-26 DIAGNOSIS — Z8249 Family history of ischemic heart disease and other diseases of the circulatory system: Secondary | ICD-10-CM

## 2019-08-26 DIAGNOSIS — E7800 Pure hypercholesterolemia, unspecified: Secondary | ICD-10-CM | POA: Diagnosis not present

## 2019-08-26 DIAGNOSIS — E1169 Type 2 diabetes mellitus with other specified complication: Secondary | ICD-10-CM | POA: Diagnosis not present

## 2019-08-26 DIAGNOSIS — R079 Chest pain, unspecified: Secondary | ICD-10-CM | POA: Diagnosis not present

## 2019-08-26 MED ORDER — LOSARTAN POTASSIUM 25 MG PO TABS: 25.0000 mg | ORAL_TABLET | Freq: Every day | ORAL | 3 refills | Status: DC

## 2019-08-26 NOTE — Patient Instructions (Addendum)
Medication Instructions:  Losartan 25 mg daily , 90 days refills  If you need a refill on your cardiac medications before your next appointment, please call your pharmacy.    Lab work: No new labs needed   If you have labs (blood work) drawn today and your tests are completely normal, you will receive your results only by: Marland Kitchen MyChart Message (if you have MyChart) OR . A paper copy in the mail If you have any lab test that is abnormal or we need to change your treatment, we will call you to review the results.   Testing/Procedures: Your physician has order a CT Calcium Scoring. This procedure uses special x-ray equipment to produce pictures of the coronary arteries to determine if they are blocked or narrowed by the buildup of plaque - an indicator for atherosclerosis or coronary artery disease (CAD).  Located at our [facility].  Cost is $150. Insurance does not cover.  [place]. [address], [place], [place] [address]  Call [phone] to schedule at your convenience.    Follow-Up: At [facility], you and your health needs are our priority.  As part of our continuing mission to provide you with exceptional heart care, we have created designated Provider Care Teams.  These Care Teams include your primary Cardiologist (physician) and Advanced Practice Providers (APPs -  Physician Assistants and Nurse Practitioners) who all work together to provide you with the care you need, when you need it.  . You will need a follow up appointment as needed   . Providers on your designated Care Team:   . Murray Hodgkins, NP . Christell Faith, PA-C . Marrianne Mood, PA-C  Any Other Special Instructions Will Be Listed Below (If Applicable).  For educational health videos Log in to : [email or web address] Or : [email or web address], password : [email or web address]   Coronary Calcium Scan A coronary calcium scan is an imaging test used to look for deposits of calcium and other fatty materials (plaques)  in the inner lining of the blood vessels of the heart (coronary arteries). These deposits of calcium and plaques can partly clog and narrow the coronary arteries without producing any symptoms or warning signs. This puts a person at risk for a heart attack. This test can detect these deposits before symptoms develop. Tell a health care provider about:  Any allergies you have.  All medicines you are taking, including vitamins, herbs, eye drops, creams, and over-the-counter medicines.  Any problems you or family members have had with anesthetic medicines.  Any blood disorders you have.  Any surgeries you have had.  Any medical conditions you have.  Whether you are pregnant or may be pregnant. What are the risks? Generally, this is a safe procedure. However, problems may occur, including:  Harm to a pregnant woman and her unborn baby. This test involves the use of radiation. Radiation exposure can be dangerous to a pregnant woman and her unborn baby. If you are pregnant, you generally should not have this procedure done.  Slight increase in the risk of cancer. This is because of the radiation involved in the test. What happens before the procedure? No preparation is needed for this procedure. What happens during the procedure?   You will undress and remove any jewelry around your neck or chest.  You will put on a hospital gown.  Sticky electrodes will be placed on your chest. The electrodes will be connected to an electrocardiogram (ECG) machine to record a tracing of the electrical activity of  your heart.  A CT scanner will take pictures of your heart. During this time, you will be asked to lie still and hold your breath for 2-3 seconds while a picture of your heart is being taken. The procedure may vary among health care providers and hospitals. What happens after the procedure?  You can get dressed.  You can return to your normal activities.  It is up to you to get the  results of your test. Ask your health care provider, or the department that is doing the test, when your results will be ready. Summary  A coronary calcium scan is an imaging test used to look for deposits of calcium and other fatty materials (plaques) in the inner lining of the blood vessels of the heart (coronary arteries).  Generally, this is a safe procedure. Tell your health care provider if you are pregnant or may be pregnant.  No preparation is needed for this procedure.  A CT scanner will take pictures of your heart.  You can return to your normal activities after the scan is done. This information is not intended to replace advice given to you by your health care provider. Make sure you discuss any questions you have with your health care provider. Document Released: 05/04/2008 Document Revised: 10/19/2017 Document Reviewed: 09/25/2016 Elsevier Patient Education  2020 [employer].

## 2019-09-03 DIAGNOSIS — M1711 Unilateral primary osteoarthritis, right knee: Secondary | ICD-10-CM | POA: Diagnosis not present

## 2019-09-04 ENCOUNTER — Other Ambulatory Visit: Payer: Self-pay

## 2019-09-04 ENCOUNTER — Ambulatory Visit (INDEPENDENT_AMBULATORY_CARE_PROVIDER_SITE_OTHER)
Admission: RE | Admit: 2019-09-04 | Discharge: 2019-09-04 | Disposition: A | Discharge status: Home / Self Care | Payer: Medicare Other | Source: Ambulatory Visit | Attending: Cardiovascular Disease | Admitting: Cardiovascular Disease

## 2019-09-04 DIAGNOSIS — E7800 Pure hypercholesterolemia, unspecified: Secondary | ICD-10-CM

## 2019-09-04 DIAGNOSIS — Z23 Encounter for immunization: Secondary | ICD-10-CM | POA: Diagnosis not present

## 2019-09-04 DIAGNOSIS — Z8249 Family history of ischemic heart disease and other diseases of the circulatory system: Secondary | ICD-10-CM

## 2019-09-04 DIAGNOSIS — R079 Chest pain, unspecified: Secondary | ICD-10-CM

## 2019-09-08 ENCOUNTER — Telehealth: Payer: Self-pay

## 2019-09-08 NOTE — Telephone Encounter (Signed)
-----   Message from Minna Merritts, MD sent at 09/04/2019  6:15 PM EDT ----- CT coronary calcium scoring Score is elevated 326 No further testing needed but will need to be aggressive with cholesterol and diabetes Goal LDL in follow-up lab work is less than 70, preferably 60

## 2019-09-16 NOTE — Telephone Encounter (Signed)
Attempted to call patient. LMTCB 09/16/2019

## 2019-10-06 ENCOUNTER — Other Ambulatory Visit: Payer: Self-pay

## 2019-10-06 DIAGNOSIS — Z20822 Contact with and (suspected) exposure to covid-19: Secondary | ICD-10-CM

## 2019-10-06 DIAGNOSIS — Z20828 Contact with and (suspected) exposure to other viral communicable diseases: Secondary | ICD-10-CM | POA: Diagnosis not present

## 2019-10-08 LAB — NOVEL CORONAVIRUS, NAA: SARS-CoV-2, NAA: DETECTED — AB

## 2019-10-09 ENCOUNTER — Encounter: Payer: Self-pay | Admitting: Family Medicine

## 2019-10-09 ENCOUNTER — Ambulatory Visit (INDEPENDENT_AMBULATORY_CARE_PROVIDER_SITE_OTHER): Payer: Medicare Other | Admitting: Family Medicine

## 2019-10-09 ENCOUNTER — Other Ambulatory Visit: Payer: Self-pay

## 2019-10-09 DIAGNOSIS — U071 COVID-19: Secondary | ICD-10-CM

## 2019-10-09 DIAGNOSIS — B029 Zoster without complications: Secondary | ICD-10-CM

## 2019-10-09 MED ORDER — METFORMIN HCL 500 MG PO TABS: 1000.0000 mg | ORAL_TABLET | Freq: Two times a day (BID) | ORAL | 3 refills | Status: DC

## 2019-10-09 MED ORDER — LOSARTAN POTASSIUM 25 MG PO TABS: 25.0000 mg | ORAL_TABLET | Freq: Every day | ORAL | 3 refills | Status: DC

## 2019-10-09 MED ORDER — ROSUVASTATIN CALCIUM 10 MG PO TABS: 10.0000 mg | ORAL_TABLET | Freq: Every day | ORAL | 3 refills | Status: DC

## 2019-10-09 MED ORDER — VALACYCLOVIR HCL 1 G PO TABS: 1000.0000 mg | ORAL_TABLET | Freq: Two times a day (BID) | ORAL | 0 refills | Status: DC

## 2019-10-09 MED ORDER — PREDNISONE 10 MG PO TABS: ORAL_TABLET | ORAL | 0 refills | Status: DC

## 2019-10-09 NOTE — Progress Notes (Signed)
There were no vitals taken for this visit.   Subjective:    Patient ID: Hunter Peters, male    DOB: [birth year], 68 y.o.   MRN: [id number]  HPI: Hunter Peters is a 68 y.o. male  Chief Complaint  Patient presents with  . COVID   Has been havign symptoms since Last Saturday. Has been in bed for 5 days. Hasn't been eating. Has been coughing. Has been feeling tired. He is not feeling well and has been feeling frustrated based on how he is feeling. He notes that he has had shingles 2x that has affected his eye. Since Saturday, he has been having pain there in the same spot. He is concerned that he is going into a shingles flare again. He has not had any rash, but he's concerned.  Cough: no Shortness of breath: no Wheezing: no Chest pain: no Chest tightness: no Chest congestion: no Nasal congestion: no Runny nose: no Post nasal drip: no Sneezing: no Sore throat: no Swollen glands: no Sinus pressure: no Headache: yes Face pain: yes Toothache: no Ear pain: no  Ear pressure: no  Eyes red/itching:no Eye drainage/crusting: no  Vomiting: no Rash: no Fatigue: yes Sick contacts: yes Strep contacts: no  Context: stable Recurrent sinusitis: no Relief with OTC cold/cough medications: no  Treatments attempted: none  Relevant past medical, surgical, family and social history reviewed and updated as indicated. Interim medical history since our last visit reviewed. Allergies and medications reviewed and updated.  Review of Systems  Constitutional: Positive for fatigue. Negative for activity change, appetite change, chills, diaphoresis, fever and unexpected weight change.  HENT: Negative.   Respiratory: Negative.   Cardiovascular: Negative.   Gastrointestinal: Negative.   Musculoskeletal: Negative.   Skin: Negative.  Negative for color change, pallor, rash and wound.  Psychiatric/Behavioral: Negative.     Per HPI unless specifically indicated above     Objective:    There were  no vitals taken for this visit.  Wt Readings from Last 3 Encounters:  08/26/19 229 lb (103.9 kg)  08/07/19 225 lb 6.4 oz (102.2 kg)  06/12/18 223 lb 6.4 oz (101.3 kg)    Physical Exam Vitals signs and nursing note reviewed.  Constitutional:      General: He is not in acute distress.    Appearance: Normal appearance. He is not ill-appearing, toxic-appearing or diaphoretic.  HENT:     Head: Normocephalic and atraumatic.     Right Ear: External ear normal.     Left Ear: External ear normal.     Nose: Nose normal.     Mouth/Throat:     Mouth: Mucous membranes are moist.     Pharynx: Oropharynx is clear.  Eyes:     General: No scleral icterus.       Right eye: No discharge.        Left eye: No discharge.     Conjunctiva/sclera: Conjunctivae normal.     Pupils: Pupils are equal, round, and reactive to light.  Neck:     Musculoskeletal: Normal range of motion.  Pulmonary:     Effort: Pulmonary effort is normal. No respiratory distress.     Comments: Speaking in full sentences Musculoskeletal: Normal range of motion.  Skin:    Coloration: Skin is not jaundiced or pale.     Findings: No bruising, erythema, lesion or rash.  Neurological:     Mental Status: He is alert and oriented to person, place, and time. Mental status is at baseline.  Psychiatric:        Mood and Affect: Mood normal.        Behavior: Behavior normal.        Thought Content: Thought content normal.        Judgment: Judgment normal.     Results for orders placed or performed in visit on 10/06/19  Novel Coronavirus, NAA ([facility])   Specimen: Nasopharyngeal(NP) swabs in vial transport medium   NASOPHARYNGE  TESTING  Result Value Ref Range   SARS-CoV-2, NAA Detected (A) Not Detected      Assessment & Plan:   Problem List Items Addressed This Visit    None    Visit Diagnoses    COVID-19    -  Primary   Continue symptomatic care. Will add prednisone for shingles and for the COVID. Call with any concerns.  Continue to monitor.    Relevant Medications   valACYclovir (VALTREX) 1000 MG tablet   Herpes zoster without complication       No rash, but as he's had it before, will treat with valacyclovir and prednisone. Call if not getting better or getting worse.    Relevant Medications   valACYclovir (VALTREX) 1000 MG tablet       Follow up plan: Return if symptoms worsen or fail to improve.   . This visit was completed via FaceTime due to the restrictions of the COVID-19 pandemic. All issues as above were discussed and addressed. Physical exam was done as above through visual confirmation on FaceTime. If it was felt that the patient should be evaluated in the office, they were directed there. The patient verbally consented to this visit. . Location of the patient: home . Location of the provider: work . Those involved with this call:  . Provider: Park Liter, DO . CMA: Tiffany Reel, CMA . Front Desk/Registration: Don Perking  . Time spent on call: 25 minutes with patient face to face via video conference. More than 50% of this time was spent in counseling and coordination of care. 40 minutes total spent in review of patient's record and preparation of their chart.

## 2019-10-12 ENCOUNTER — Encounter: Payer: Self-pay | Admitting: Emergency Medicine

## 2019-10-12 ENCOUNTER — Inpatient Hospital Stay
Admission: EM | Admit: 2019-10-12 | Discharge: 2019-10-13 | DRG: 177 | Disposition: A | Discharge status: Other Acute Inpatient Hospital | Payer: Medicare Other | Attending: Internal Medicine | Admitting: Internal Medicine

## 2019-10-12 ENCOUNTER — Emergency Department: Payer: Medicare Other

## 2019-10-12 ENCOUNTER — Encounter: Payer: Self-pay | Admitting: Family Medicine

## 2019-10-12 ENCOUNTER — Other Ambulatory Visit: Payer: Self-pay

## 2019-10-12 DIAGNOSIS — J1289 Other viral pneumonia: Secondary | ICD-10-CM | POA: Diagnosis not present

## 2019-10-12 DIAGNOSIS — F17290 Nicotine dependence, other tobacco product, uncomplicated: Secondary | ICD-10-CM | POA: Diagnosis present

## 2019-10-12 DIAGNOSIS — J22 Unspecified acute lower respiratory infection: Secondary | ICD-10-CM

## 2019-10-12 DIAGNOSIS — J1282 Pneumonia due to coronavirus disease 2019: Secondary | ICD-10-CM | POA: Diagnosis present

## 2019-10-12 DIAGNOSIS — U071 COVID-19: Secondary | ICD-10-CM | POA: Diagnosis not present

## 2019-10-12 DIAGNOSIS — Z7984 Long term (current) use of oral hypoglycemic drugs: Secondary | ICD-10-CM

## 2019-10-12 DIAGNOSIS — Z882 Allergy status to sulfonamides status: Secondary | ICD-10-CM

## 2019-10-12 DIAGNOSIS — R069 Unspecified abnormalities of breathing: Secondary | ICD-10-CM | POA: Diagnosis not present

## 2019-10-12 DIAGNOSIS — E1169 Type 2 diabetes mellitus with other specified complication: Secondary | ICD-10-CM

## 2019-10-12 DIAGNOSIS — R0602 Shortness of breath: Secondary | ICD-10-CM | POA: Diagnosis not present

## 2019-10-12 DIAGNOSIS — E785 Hyperlipidemia, unspecified: Secondary | ICD-10-CM | POA: Diagnosis present

## 2019-10-12 DIAGNOSIS — J9601 Acute respiratory failure with hypoxia: Secondary | ICD-10-CM

## 2019-10-12 DIAGNOSIS — R7989 Other specified abnormal findings of blood chemistry: Secondary | ICD-10-CM

## 2019-10-12 DIAGNOSIS — Z820 Family history of epilepsy and other diseases of the nervous system: Secondary | ICD-10-CM

## 2019-10-12 DIAGNOSIS — E1165 Type 2 diabetes mellitus with hyperglycemia: Secondary | ICD-10-CM | POA: Diagnosis not present

## 2019-10-12 DIAGNOSIS — J129 Viral pneumonia, unspecified: Secondary | ICD-10-CM

## 2019-10-12 DIAGNOSIS — I2510 Atherosclerotic heart disease of native coronary artery without angina pectoris: Secondary | ICD-10-CM | POA: Diagnosis present

## 2019-10-12 DIAGNOSIS — R0902 Hypoxemia: Secondary | ICD-10-CM | POA: Diagnosis not present

## 2019-10-12 DIAGNOSIS — E7800 Pure hypercholesterolemia, unspecified: Secondary | ICD-10-CM | POA: Diagnosis present

## 2019-10-12 DIAGNOSIS — Z8249 Family history of ischemic heart disease and other diseases of the circulatory system: Secondary | ICD-10-CM

## 2019-10-12 DIAGNOSIS — Z79899 Other long term (current) drug therapy: Secondary | ICD-10-CM

## 2019-10-12 DIAGNOSIS — E869 Volume depletion, unspecified: Secondary | ICD-10-CM

## 2019-10-12 DIAGNOSIS — Z8619 Personal history of other infectious and parasitic diseases: Secondary | ICD-10-CM

## 2019-10-12 HISTORY — DX: Type 2 diabetes mellitus without complications: E11.9

## 2019-10-12 MED ORDER — DEXAMETHASONE SODIUM PHOSPHATE 10 MG/ML IJ SOLN
10.0000 mg | Freq: Once | INTRAMUSCULAR | Status: AC
  Administered 2019-10-12: 10 mg via INTRAVENOUS
  Filled 2019-10-12: qty 1

## 2019-10-12 NOTE — ED Provider Notes (Signed)
[facility] Emergency Department Provider Note  ____________________________________________   First MD Initiated Contact with Patient 10/12/19 2340     (approximate)  I have reviewed the triage vital signs and the nursing notes.   HISTORY  Chief Complaint Shortness of Breath    HPI Hunter Peters is a 68 y.o. male who generally is healthy and active and still becoming symptomatic about 12 days ago with shortness of breath, diarrhea, body aches, and fever, who was positively diagnosed with COVID-19 approximately 6 days ago.  He presents by EMS tonight for worsening shortness of breath.  He reports that he has been trying to drink fluids but is not certain if he is dehydrated.  He no longer has fevers or chills and he denies any pain, but says the shortness of breath is gotten steadily worse and is now severe.  It is much worse with any kind of exertion.  Tonight he says he was hallucinating and "screaming".  When EMS arrived he had oxygen saturation of about 78% on room air and he does not have a baseline oxygen requirement at home with no prior history of lung disease.  They placed him on a nonrebreather which brought him up to about 94% on 15 L.  He reports that he feels better with oxygen, nothing else in particular has helped him.  He has been on prednisone by his primary care doctor and is still taking about 50 mg a day.  He has diarrhea which is been unchanged for about a week.  No abdominal pain, some chest pain but mostly when he coughs.  No history of blood clots in the legs or lungs and no history of coronary artery disease or prior ACS.  He has some pain throughout his head which is essentially unchanged.  He was recently also started on valacyclovir for what seems to be neuropathic pain along his scalp but without any obvious rash or lesions.         Past Medical History:  Diagnosis Date  . Diabetes mellitus without complication [facility])     Patient  Active Problem List   Diagnosis Date Noted  . Pneumonia due to COVID-19 virus 10/13/2019  . Acute respiratory failure ([place]) 10/13/2019  . Type 2 diabetes mellitus with hyperlipidemia ([place]) 10/13/2019  . Hypercholesterolemia 08/06/2019  . Chest pain 08/06/2019  . Advance care planning 08/06/2019  . Diverticulitis 04/28/2019  . Diabetes mellitus associated with hormonal etiology ([place]) 03/30/2016    Past Surgical History:  Procedure Laterality Date  . Eyelid Surgery Bilateral   . INTRAOCULAR LENS INSERTION Left 7/11  . KNEE ARTHROSCOPY Right     Prior to Admission medications   Medication Sig Start Date End Date Taking? Authorizing Provider  betamethasone dipropionate (DIPROLENE) 0.05 % cream Apply topically 2 (two) times daily. 03/30/16   Guadalupe Maple, MD  fluorometholone (FML) 0.1 % ophthalmic suspension 1 drop daily. 01/27/16   [provider]  losartan (COZAAR) 25 MG tablet Take 1 tablet (25 mg total) by mouth daily. 10/09/19 01/07/20  Park Liter P, DO  metFORMIN (GLUCOPHAGE) 500 MG tablet Take 2 tablets (1,000 mg total) by mouth 2 (two) times daily with a meal. 10/09/19   Johnson, Megan P, DO  predniSONE (DELTASONE) 10 MG tablet 6 tabs today and tomorrow, 5 tabs the next 2 days, decrease by 1 every other until gone. 10/09/19   Johnson, Megan P, DO  rosuvastatin (CRESTOR) 10 MG tablet Take 1 tablet (10 mg total)  by mouth daily. 10/09/19   Johnson, Megan P, DO  valACYclovir (VALTREX) 1000 MG tablet Take 1 tablet (1,000 mg total) by mouth 2 (two) times daily. 10/09/19   Park Liter P, DO    Allergies Sulfonamide derivatives  Family History  Problem Relation Age of Onset  . Alzheimer's disease Mother   . Hypertension Father     Social History Social History   Tobacco Use  . Smoking status: Current Some Day Smoker    Types: Cigars  . Smokeless tobacco: Never Used  . Tobacco comment: occ.  Substance Use Topics  . Alcohol use: Yes    Alcohol/week: 0.0  standard drinks    Comment: occasional  . Drug use: No    Review of Systems Constitutional: No fever/chills Eyes: No visual changes. ENT: No sore throat. Cardiovascular: Denies chest pain. Respiratory: +shortness of breath. Gastrointestinal: Decreased oral intake.  No abdominal pain.  No nausea, no vomiting.  +diarrhea.  No constipation. Genitourinary: Negative for dysuria. Musculoskeletal: Negative for neck pain.  Negative for back pain. Integumentary: Negative for rash but with pain on his scalp for which she was started on Valtrex. Neurological: Generalized headache, no focal weakness or numbness.   ____________________________________________   PHYSICAL EXAM:  VITAL SIGNS: ED Triage Vitals  Enc Vitals Group     BP 10/12/19 2340 134/83     Pulse Rate 10/12/19 2340 82     Resp 10/12/19 2340 (!) 26     Temp --      Temp src --      SpO2 10/12/19 2340 93 %     Weight 10/12/19 2341 95.3 kg (210 lb)     Height 10/12/19 2341 1.778 m (5\' 10" )     Head Circumference --      Peak Flow --      Pain Score 10/12/19 2340 6     Pain Loc --      Pain Edu? --      Excl. in [place]? --     Constitutional: Alert and oriented.  Mild tachypnea on 4 L of oxygen by nasal cannula. Eyes: Conjunctivae are normal.  Head: Atraumatic. Nose: No congestion/rhinnorhea. Mouth/Throat: Patient is wearing a mask. Neck: No stridor.  No meningeal signs.   Cardiovascular: Normal rate, regular rhythm. Good peripheral circulation. Grossly normal heart sounds. Respiratory: Mild tachypnea, speaking in full sentences.  No accessory muscle usage or retractions at this time.  Some coarse breath sounds. Gastrointestinal: Soft and nontender. No distention.  Musculoskeletal: No lower extremity tenderness nor edema. No gross deformities of extremities. Neurologic:  Normal speech and language. No gross focal neurologic deficits are appreciated.  Skin:  Skin is warm, dry and intact. Psychiatric: Mood and affect are  normal. Speech and behavior are normal.  ____________________________________________   LABS (all labs ordered are listed, but only abnormal results are displayed)  Labs Reviewed  LACTIC ACID, PLASMA - Abnormal; Notable for the following components:      Result Value   Lactic Acid, Venous 2.7 (*)    All other components within normal limits  CBC WITH DIFFERENTIAL/PLATELET - Abnormal; Notable for the following components:   Platelets 512 (*)    nRBC 0.3 (*)    Lymphs Abs 0.6 (*)    Abs Immature Granulocytes 0.08 (*)    All other components within normal limits  COMPREHENSIVE METABOLIC PANEL - Abnormal; Notable for the following components:   Glucose, Bld 290 (*)    Calcium 8.0 (*)    Albumin  2.7 (*)    AST 44 (*)    All other components within normal limits  FIBRIN DERIVATIVES D-DIMER ([facility] ONLY) - Abnormal; Notable for the following components:   Fibrin derivatives D-dimer [facility]) 1,897.80 (*)    All other components within normal limits  LACTATE DEHYDROGENASE - Abnormal; Notable for the following components:   LDH 466 (*)    All other components within normal limits  FERRITIN - Abnormal; Notable for the following components:   Ferritin 1,295 (*)    All other components within normal limits  FIBRINOGEN - Abnormal; Notable for the following components:   Fibrinogen >750 (*)    All other components within normal limits  BRAIN NATRIURETIC PEPTIDE - Abnormal; Notable for the following components:   B Natriuretic Peptide 116.0 (*)    All other components within normal limits  PROCALCITONIN  TRIGLYCERIDES  LACTIC ACID, PLASMA  C-REACTIVE PROTEIN  COMPREHENSIVE METABOLIC PANEL  HIV ANTIBODY (ROUTINE TESTING W REFLEX)  HEMOGLOBIN A1C  ABO/RH  TROPONIN I (HIGH SENSITIVITY)  TROPONIN I (HIGH SENSITIVITY)   ____________________________________________  EKG  ED ECG REPORT I, Hinda Kehr, the attending physician, personally viewed and interpreted this ECG.  Date:  10/12/2019 EKG Time: 23: 39 Rate: 84 Rhythm: normal sinus rhythm QRS Axis: normal Intervals: normal ST/T Wave abnormalities: normal Narrative Interpretation: no evidence of acute ischemia  ____________________________________________  RADIOLOGY I, Hinda Kehr, personally viewed and evaluated these images (plain radiographs) as part of my medical decision making, as well as reviewing the written report by the radiologist.  ED MD interpretation: Multifocal pneumonia consistent with his history of COVID-19  Official radiology report(s): Dg Chest Port 1 View  Result Date: 10/13/2019 CLINICAL DATA:  COVID-19, worsening dyspnea EXAM: PORTABLE CHEST 1 VIEW COMPARISON:  Coronary CT 09/04/2019, chest radiograph 05/17/2010 FINDINGS: Multifocal areas of interstitial and airspace opacity throughout both lungs with diminished volumes and streaky basilar areas of subsegmental atelectasis. No pneumothorax or effusion. Cardiomediastinal prominence likely accentuated by portable technique. No acute osseous or soft tissue abnormality. Degenerative changes are present in the imaged spine and shoulders. IMPRESSION: Multifocal areas of interstitial and airspace opacity throughout both lungs, compatible with multifocal pneumonia. Electronically Signed   By: Lovena Le M.D.   On: 10/13/2019 00:18    ____________________________________________   PROCEDURES   Procedure(s) performed (including Critical Care):  Procedures   ____________________________________________   INITIAL IMPRESSION / MDM / [place] / ED COURSE  As part of my medical decision making, I reviewed the following data within the [place] notes reviewed and incorporated, Labs reviewed , EKG interpreted , Old chart reviewed, Radiograph reviewed , Discussed with admitting physician  and reviewed Notes from prior ED visits   Differential diagnosis includes, but is not limited to, COVID-19  pneumonia, bacterial superinfection, pulmonary embolism, ACS.  Patient responded well to oxygen therapy and currently is on 4 L satting at 93% at rest.  Mild tachypnea but no accessory muscles.  No need for invasive or positive pressure ventilation at this time.  Patient's vital signs are otherwise stable.  Strongly suspect worsening viral pneumonia as result of his known COVID-19 infection.  He has been on prednisone but I will give him a dose of Decadron 10 mg IV now and will perform the full COVID-19 lab work-up including D-dimer, not because of concern for PE, but because of COVID-19 protocol.  Patient does not currently meet sepsis criteria.  Since he has been ill for some time and not  eating or drinking well I will start him on gentle IV fluids but not bolus him unless he has evidence of renal dysfunction.  Anticipate hospitalist admission versus transfer to [facility] but currently [facility] is full.  I have made the patient aware of the plan and he understands.      Clinical Course as of Oct 12 233  Mon Oct 13, 2019  0028 Normal CBC except for slightly increased platelets which could be indicative of volume depletion  CBC WITH DIFFERENTIAL(!) [CF]  0030 Chest x-ray consistent with multifocal pneumonia  DG Chest Port 1 View [CF]  [phone] Patient is now 88% on 6L [place].  However he reports feeling relatively comfortable.  Will hold and observe for now, then increase to high flow [place] if necessary or if patient continues to drop spO2   [CF]  0057 Will give a 554mL fluid bolus given decreased oral intake and suggestion of volume depletion based on history and increased platelets, but will be careful not to give too many fluids given COVID status.  Consulting hospitalist for admission (verified [place] [facility] is full).  Lactic Acid, Venous(!!): 2.7 [CF]  0100 Essentially normal CMP  Comprehensive metabolic panel(!) [CF]  [id number] Discussed case by phone with Dr. Ileene Musa with the hospitalist service.  She  will admit.   [CF]  [phone] Essentially all of the patient's "Covid panel" labs are elevated as one would expect including the ferritin, D-dimer, fibrinogen, etc.   [CF]    Clinical Course User Index [CF] Hinda Kehr, MD     ____________________________________________  FINAL CLINICAL IMPRESSION(S) / ED DIAGNOSES  Final diagnoses:  Lower respiratory tract infection due to COVID-19 virus  Viral pneumonia  Acute respiratory failure with hypoxemia (HCC)  Volume depletion  Elevated lactic acid level     MEDICATIONS GIVEN DURING THIS VISIT:  Medications  0.9 %  sodium chloride infusion (1,000 mLs Intravenous New Bag/Given 10/13/19 0037)  remdesivir 200 mg in sodium chloride 0.9 % 250 mL IVPB (200 mg Intravenous New Bag/Given 10/13/19 0224)    Followed by  remdesivir 100 mg in sodium chloride 0.9 % 250 mL IVPB (has no administration in time range)  enoxaparin (LOVENOX) injection 40 mg (has no administration in time range)  acetaminophen (TYLENOL) tablet 650 mg (has no administration in time range)  losartan (COZAAR) tablet 25 mg (has no administration in time range)  rosuvastatin (CRESTOR) tablet 10 mg (has no administration in time range)  insulin aspart (novoLOG) injection 0-15 Units (has no administration in time range)  dexamethasone (DECADRON) injection 10 mg (has no administration in time range)  dexamethasone (DECADRON) injection 10 mg (10 mg Intravenous Given 10/12/19 2352)  sodium chloride 0.9 % bolus 500 mL ( Intravenous Stopped 10/13/19 0214)     ED Discharge Orders    None      *Please note:  CHRISTOFER CARRISALEZ was evaluated in Emergency Department on 10/13/2019 for the symptoms described in the history of present illness. He was evaluated in the context of the global COVID-19 pandemic, which necessitated consideration that the patient might be at risk for infection with the SARS-CoV-2 virus that causes COVID-19. Institutional protocols and algorithms that pertain to the  evaluation of patients at risk for COVID-19 are in a state of rapid change based on information released by regulatory bodies including the CDC and federal and state organizations. These policies and algorithms were followed during the patient's care in the ED.  Some ED evaluations and interventions may be  delayed as a result of limited staffing during the pandemic.*  Note:  This document was prepared using Dragon voice recognition software and may include unintentional dictation errors.   Hinda Kehr, MD 10/13/19 [phone]

## 2019-10-12 NOTE — ED Triage Notes (Signed)
Pt to ED via EMS from home c/o SOB x2 days.  Was diagnosed with COVID this past Monday.  Was found to be 78-80% RA oxygen sat by EMS, placed on 15L NRB and up to 94%.  CBG at home 347 with hx of diabetes and on prednisone by PCP.  States doesn't feel like he's getting a good breath, only pain is a headache.  Presents A&Ox4, speaking in complete and coherent sentences.  Dr. Karma Greaser at bedside for assessment.

## 2019-10-13 ENCOUNTER — Inpatient Hospital Stay (HOSPITAL_COMMUNITY)
Admission: AD | Admit: 2019-10-13 | Discharge: 2019-10-18 | DRG: 177 | Disposition: A | Discharge status: Home / Self Care | Payer: Medicare Other | Source: Other Acute Inpatient Hospital | Attending: Internal Medicine | Admitting: Internal Medicine

## 2019-10-13 DIAGNOSIS — Z820 Family history of epilepsy and other diseases of the nervous system: Secondary | ICD-10-CM

## 2019-10-13 DIAGNOSIS — F17290 Nicotine dependence, other tobacco product, uncomplicated: Secondary | ICD-10-CM | POA: Diagnosis present

## 2019-10-13 DIAGNOSIS — I2699 Other pulmonary embolism without acute cor pulmonale: Secondary | ICD-10-CM | POA: Diagnosis not present

## 2019-10-13 DIAGNOSIS — I10 Essential (primary) hypertension: Secondary | ICD-10-CM | POA: Diagnosis present

## 2019-10-13 DIAGNOSIS — U071 COVID-19: Secondary | ICD-10-CM | POA: Diagnosis present

## 2019-10-13 DIAGNOSIS — E1165 Type 2 diabetes mellitus with hyperglycemia: Secondary | ICD-10-CM | POA: Diagnosis present

## 2019-10-13 DIAGNOSIS — R0602 Shortness of breath: Secondary | ICD-10-CM | POA: Diagnosis not present

## 2019-10-13 DIAGNOSIS — J1289 Other viral pneumonia: Secondary | ICD-10-CM | POA: Diagnosis present

## 2019-10-13 DIAGNOSIS — E785 Hyperlipidemia, unspecified: Secondary | ICD-10-CM

## 2019-10-13 DIAGNOSIS — Z8619 Personal history of other infectious and parasitic diseases: Secondary | ICD-10-CM | POA: Diagnosis not present

## 2019-10-13 DIAGNOSIS — Z7984 Long term (current) use of oral hypoglycemic drugs: Secondary | ICD-10-CM

## 2019-10-13 DIAGNOSIS — R7989 Other specified abnormal findings of blood chemistry: Secondary | ICD-10-CM | POA: Diagnosis present

## 2019-10-13 DIAGNOSIS — J9601 Acute respiratory failure with hypoxia: Secondary | ICD-10-CM

## 2019-10-13 DIAGNOSIS — E119 Type 2 diabetes mellitus without complications: Secondary | ICD-10-CM

## 2019-10-13 DIAGNOSIS — Z8249 Family history of ischemic heart disease and other diseases of the circulatory system: Secondary | ICD-10-CM

## 2019-10-13 DIAGNOSIS — I2609 Other pulmonary embolism with acute cor pulmonale: Secondary | ICD-10-CM | POA: Diagnosis not present

## 2019-10-13 DIAGNOSIS — Z7901 Long term (current) use of anticoagulants: Secondary | ICD-10-CM

## 2019-10-13 DIAGNOSIS — I2510 Atherosclerotic heart disease of native coronary artery without angina pectoris: Secondary | ICD-10-CM | POA: Diagnosis present

## 2019-10-13 DIAGNOSIS — J9600 Acute respiratory failure, unspecified whether with hypoxia or hypercapnia: Secondary | ICD-10-CM

## 2019-10-13 DIAGNOSIS — E1169 Type 2 diabetes mellitus with other specified complication: Secondary | ICD-10-CM | POA: Diagnosis present

## 2019-10-13 DIAGNOSIS — E869 Volume depletion, unspecified: Secondary | ICD-10-CM | POA: Diagnosis present

## 2019-10-13 DIAGNOSIS — E7800 Pure hypercholesterolemia, unspecified: Secondary | ICD-10-CM | POA: Diagnosis present

## 2019-10-13 DIAGNOSIS — Z882 Allergy status to sulfonamides status: Secondary | ICD-10-CM | POA: Diagnosis not present

## 2019-10-13 DIAGNOSIS — Z79899 Other long term (current) drug therapy: Secondary | ICD-10-CM

## 2019-10-13 DIAGNOSIS — J1282 Pneumonia due to coronavirus disease 2019: Secondary | ICD-10-CM | POA: Diagnosis present

## 2019-10-13 DIAGNOSIS — J22 Unspecified acute lower respiratory infection: Secondary | ICD-10-CM

## 2019-10-13 HISTORY — DX: Acute respiratory failure with hypoxia: J96.01

## 2019-10-13 LAB — CBC WITH DIFFERENTIAL/PLATELET
Abs Immature Granulocytes: 0.08 10*3/uL — ABNORMAL HIGH (ref 0.00–0.07)
Abs Immature Granulocytes: 0.11 10*3/uL — ABNORMAL HIGH (ref 0.00–0.07)
Basophils Absolute: 0 10*3/uL (ref 0.0–0.1)
Basophils Absolute: 0 10*3/uL (ref 0.0–0.1)
Basophils Relative: 0 %
Basophils Relative: 0 %
Eosinophils Absolute: 0 10*3/uL (ref 0.0–0.5)
Eosinophils Absolute: 0 10*3/uL (ref 0.0–0.5)
Eosinophils Relative: 0 %
Eosinophils Relative: 0 %
HCT: 40.4 % (ref 39.0–52.0)
HCT: 39.1 % (ref 39.0–52.0)
Hemoglobin: 13.8 g/dL (ref 13.0–17.0)
Hemoglobin: 13.8 g/dL (ref 13.0–17.0)
Immature Granulocytes: 1 %
Immature Granulocytes: 1 %
Lymphocytes Relative: 8 %
Lymphocytes Relative: 7 %
Lymphs Abs: 0.6 10*3/uL — ABNORMAL LOW (ref 0.7–4.0)
Lymphs Abs: 0.6 10*3/uL — ABNORMAL LOW (ref 0.7–4.0)
MCH: 29.6 pg (ref 26.0–34.0)
MCH: 29.7 pg (ref 26.0–34.0)
MCHC: 34.2 g/dL (ref 30.0–36.0)
MCHC: 35.3 g/dL (ref 30.0–36.0)
MCV: 86.5 fL (ref 80.0–100.0)
MCV: 84.1 fL (ref 80.0–100.0)
Monocytes Absolute: 0.2 10*3/uL (ref 0.1–1.0)
Monocytes Absolute: 0.2 10*3/uL (ref 0.1–1.0)
Monocytes Relative: 2 %
Monocytes Relative: 2 %
Neutro Abs: 6.6 10*3/uL (ref 1.7–7.7)
Neutro Abs: 7.8 10*3/uL — ABNORMAL HIGH (ref 1.7–7.7)
Neutrophils Relative %: 89 %
Neutrophils Relative %: 90 %
Platelets: 512 10*3/uL — ABNORMAL HIGH (ref 150–400)
Platelets: 500 10*3/uL — ABNORMAL HIGH (ref 150–400)
RBC: 4.67 MIL/uL (ref 4.22–5.81)
RBC: 4.65 MIL/uL (ref 4.22–5.81)
RDW: 13.4 % (ref 11.5–15.5)
RDW: 13.6 % (ref 11.5–15.5)
Smear Review: NORMAL
WBC: 7.4 10*3/uL (ref 4.0–10.5)
WBC: 8.6 10*3/uL (ref 4.0–10.5)
nRBC: 0.3 % — ABNORMAL HIGH (ref 0.0–0.2)
nRBC: 0.2 % (ref 0.0–0.2)

## 2019-10-13 LAB — PROCALCITONIN: Procalcitonin: <0.1 ng/mL

## 2019-10-13 LAB — FIBRIN DERIVATIVES D-DIMER (ARMC ONLY)
Fibrin derivatives D-dimer (ARMC): 1897.8 ng/mL (FEU) — ABNORMAL HIGH (ref 0.00–499.00)
Fibrin derivatives D-dimer (ARMC): 4005.48 ng/mL (FEU) — ABNORMAL HIGH (ref 0.00–499.00)

## 2019-10-13 LAB — COMPREHENSIVE METABOLIC PANEL
ALT: 40 U/L (ref 0–44)
ALT: 44 U/L (ref 0–44)
AST: 35 U/L (ref 15–41)
AST: 44 U/L — ABNORMAL HIGH (ref 15–41)
Albumin: 2.7 g/dL — ABNORMAL LOW (ref 3.5–5.0)
Albumin: 2.7 g/dL — ABNORMAL LOW (ref 3.5–5.0)
Alkaline Phosphatase: 52 U/L (ref 38–126)
Alkaline Phosphatase: 55 U/L (ref 38–126)
Anion gap: 12 (ref 5–15)
Anion gap: 13 (ref 5–15)
BUN: 21 mg/dL (ref 8–23)
BUN: 21 mg/dL (ref 8–23)
CO2: 22 mmol/L (ref 22–32)
CO2: 22 mmol/L (ref 22–32)
Calcium: 7.6 mg/dL — ABNORMAL LOW (ref 8.9–10.3)
Calcium: 8 mg/dL — ABNORMAL LOW (ref 8.9–10.3)
Chloride: 100 mmol/L (ref 98–111)
Chloride: 103 mmol/L (ref 98–111)
Creatinine, Ser: 0.74 mg/dL (ref 0.61–1.24)
Creatinine, Ser: 0.78 mg/dL (ref 0.61–1.24)
GFR calc Af Amer: >60 mL/min (ref >60)
GFR calc Af Amer: >60 mL/min (ref >60)
GFR calc non Af Amer: >60 mL/min (ref >60)
GFR calc non Af Amer: >60 mL/min (ref >60)
Glucose, Bld: 274 mg/dL — ABNORMAL HIGH (ref 70–99)
Glucose, Bld: 290 mg/dL — ABNORMAL HIGH (ref 70–99)
Potassium: 3.9 mmol/L (ref 3.5–5.1)
Potassium: 4.1 mmol/L (ref 3.5–5.1)
Sodium: 135 mmol/L (ref 135–145)
Sodium: 137 mmol/L (ref 135–145)
Total Bilirubin: 0.8 mg/dL (ref 0.3–1.2)
Total Bilirubin: 0.9 mg/dL (ref 0.3–1.2)
Total Protein: 7 g/dL (ref 6.5–8.1)
Total Protein: 7 g/dL (ref 6.5–8.1)

## 2019-10-13 LAB — FERRITIN
Ferritin: 1295 ng/mL — ABNORMAL HIGH (ref 24–336)
Ferritin: 1321 ng/mL — ABNORMAL HIGH (ref 24–336)

## 2019-10-13 LAB — TROPONIN I (HIGH SENSITIVITY)
Troponin I (High Sensitivity): 5 ng/L (ref <18)
Troponin I (High Sensitivity): 5 ng/L (ref <18)

## 2019-10-13 LAB — LACTIC ACID, PLASMA
Lactic Acid, Venous: 2.1 mmol/L (ref 0.5–1.9)
Lactic Acid, Venous: 2.7 mmol/L (ref 0.5–1.9)

## 2019-10-13 LAB — GLUCOSE, CAPILLARY
Glucose-Capillary: 269 mg/dL — ABNORMAL HIGH (ref 70–99)
Glucose-Capillary: 252 mg/dL — ABNORMAL HIGH (ref 70–99)
Glucose-Capillary: 213 mg/dL — ABNORMAL HIGH (ref 70–99)
Glucose-Capillary: 231 mg/dL — ABNORMAL HIGH (ref 70–99)
Glucose-Capillary: 247 mg/dL — ABNORMAL HIGH (ref 70–99)

## 2019-10-13 LAB — MAGNESIUM: Magnesium: 2.9 mg/dL — ABNORMAL HIGH (ref 1.7–2.4)

## 2019-10-13 LAB — ABO/RH: ABO/RH(D): A NEG

## 2019-10-13 LAB — HIV ANTIBODY (ROUTINE TESTING W REFLEX): HIV Screen 4th Generation wRfx: NONREACTIVE

## 2019-10-13 LAB — LACTATE DEHYDROGENASE: LDH: 466 U/L — ABNORMAL HIGH (ref 98–192)

## 2019-10-13 LAB — HEMOGLOBIN A1C
Hgb A1c MFr Bld: 8.2 % — ABNORMAL HIGH (ref 4.8–5.6)
Mean Plasma Glucose: 188.64 mg/dL

## 2019-10-13 LAB — BRAIN NATRIURETIC PEPTIDE: B Natriuretic Peptide: 116 pg/mL — ABNORMAL HIGH (ref 0.0–100.0)

## 2019-10-13 LAB — FIBRINOGEN: Fibrinogen: >750 mg/dL — ABNORMAL HIGH (ref 210–475)

## 2019-10-13 LAB — C-REACTIVE PROTEIN
CRP: 13.7 mg/dL — ABNORMAL HIGH (ref <1.0)
CRP: 12.1 mg/dL — ABNORMAL HIGH (ref <1.0)

## 2019-10-13 LAB — TRIGLYCERIDES: Triglycerides: 143 mg/dL (ref <150)

## 2019-10-13 LAB — MRSA PCR SCREENING: MRSA by PCR: NEGATIVE

## 2019-10-13 MED ORDER — DM-GUAIFENESIN ER 30-600 MG PO TB12: 1.0000 | ORAL_TABLET | Freq: Two times a day (BID) | ORAL | Status: DC

## 2019-10-13 MED ORDER — ROSUVASTATIN CALCIUM 10 MG PO TABS
10.0000 mg | ORAL_TABLET | Freq: Every day | ORAL | Status: DC
  Filled 2019-10-13: qty 1

## 2019-10-13 MED ORDER — LOSARTAN POTASSIUM 25 MG PO TABS
25.0000 mg | ORAL_TABLET | Freq: Every day | ORAL | Status: DC
  Administered 2019-10-14 – 2019-10-18 (×5): 25 mg via ORAL
  Filled 2019-10-13 (×5): qty 1

## 2019-10-13 MED ORDER — SODIUM CHLORIDE 0.9 % IV SOLN
100.0000 mg | Freq: Every day | INTRAVENOUS | Status: DC
  Administered 2019-10-13: 100 mg via INTRAVENOUS
  Filled 2019-10-13: qty 20
  Filled 2019-10-13: qty 100

## 2019-10-13 MED ORDER — SODIUM CHLORIDE 0.9 % IV BOLUS
500.0000 mL | Freq: Once | INTRAVENOUS | Status: AC
  Administered 2019-10-13: 500 mL via INTRAVENOUS

## 2019-10-13 MED ORDER — GUAIFENESIN ER 600 MG PO TB12
600.0000 mg | ORAL_TABLET | Freq: Two times a day (BID) | ORAL | Status: DC
  Administered 2019-10-13: 600 mg via ORAL
  Filled 2019-10-13: qty 1

## 2019-10-13 MED ORDER — SODIUM CHLORIDE 0.9% FLUSH: 3.0000 mL | INTRAVENOUS | Status: DC | PRN

## 2019-10-13 MED ORDER — ENOXAPARIN SODIUM 40 MG/0.4ML ~~LOC~~ SOLN
40.0000 mg | Freq: Two times a day (BID) | SUBCUTANEOUS | Status: DC
  Filled 2019-10-13: qty 0.4

## 2019-10-13 MED ORDER — SODIUM CHLORIDE 0.9 % IV SOLN
100.0000 mg | INTRAVENOUS | Status: DC
  Filled 2019-10-13: qty 20

## 2019-10-13 MED ORDER — SODIUM CHLORIDE 0.9 % IV SOLN
200.0000 mg | Freq: Once | INTRAVENOUS | Status: AC
  Administered 2019-10-13: 200 mg via INTRAVENOUS
  Filled 2019-10-13: qty 40

## 2019-10-13 MED ORDER — DEXAMETHASONE SODIUM PHOSPHATE 10 MG/ML IJ SOLN
6.0000 mg | Freq: Two times a day (BID) | INTRAMUSCULAR | Status: DC
  Administered 2019-10-13 – 2019-10-18 (×10): 6 mg via INTRAVENOUS
  Filled 2019-10-13 (×10): qty 1

## 2019-10-13 MED ORDER — ZINC SULFATE 220 (50 ZN) MG PO CAPS
220.0000 mg | ORAL_CAPSULE | Freq: Every day | ORAL | Status: DC
  Administered 2019-10-13: 09:00:00 220 mg via ORAL
  Filled 2019-10-13: qty 1

## 2019-10-13 MED ORDER — FUROSEMIDE 10 MG/ML IJ SOLN
20.0000 mg | Freq: Once | INTRAMUSCULAR | Status: AC
  Administered 2019-10-13: 20 mg via INTRAVENOUS
  Filled 2019-10-13: qty 4

## 2019-10-13 MED ORDER — ACETAMINOPHEN 325 MG PO TABS: 650.0000 mg | ORAL_TABLET | Freq: Four times a day (QID) | ORAL | Status: DC | PRN

## 2019-10-13 MED ORDER — INSULIN ASPART 100 UNIT/ML ~~LOC~~ SOLN
6.0000 [IU] | Freq: Three times a day (TID) | SUBCUTANEOUS | Status: DC
  Administered 2019-10-14 – 2019-10-18 (×13): 6 [IU] via SUBCUTANEOUS

## 2019-10-13 MED ORDER — DEXTROMETHORPHAN POLISTIREX ER 30 MG/5ML PO SUER
30.0000 mg | Freq: Two times a day (BID) | ORAL | Status: DC
  Administered 2019-10-13: 30 mg via ORAL
  Filled 2019-10-13 (×2): qty 5

## 2019-10-13 MED ORDER — ENOXAPARIN SODIUM 40 MG/0.4ML ~~LOC~~ SOLN: 40.0000 mg | SUBCUTANEOUS | Status: DC

## 2019-10-13 MED ORDER — INSULIN ASPART 100 UNIT/ML ~~LOC~~ SOLN
0.0000 [IU] | Freq: Three times a day (TID) | SUBCUTANEOUS | Status: DC
  Administered 2019-10-13: 5 [IU] via SUBCUTANEOUS
  Administered 2019-10-13: 09:00:00 8 [IU] via SUBCUTANEOUS
  Filled 2019-10-13 (×2): qty 1

## 2019-10-13 MED ORDER — SODIUM CHLORIDE 0.9 % IV SOLN
1000.0000 mL | INTRAVENOUS | Status: DC
  Administered 2019-10-13: 1000 mL via INTRAVENOUS

## 2019-10-13 MED ORDER — ROSUVASTATIN CALCIUM 5 MG PO TABS
10.0000 mg | ORAL_TABLET | Freq: Every day | ORAL | Status: DC
  Administered 2019-10-13 – 2019-10-18 (×6): 10 mg via ORAL
  Filled 2019-10-13 (×6): qty 2

## 2019-10-13 MED ORDER — SODIUM CHLORIDE 0.9 % IV SOLN: 250.0000 mL | INTRAVENOUS | Status: DC | PRN

## 2019-10-13 MED ORDER — BENZONATATE 100 MG PO CAPS
100.0000 mg | ORAL_CAPSULE | Freq: Two times a day (BID) | ORAL | Status: DC
  Administered 2019-10-13: 09:00:00 100 mg via ORAL
  Filled 2019-10-13: qty 1

## 2019-10-13 MED ORDER — INSULIN ASPART 100 UNIT/ML ~~LOC~~ SOLN
0.0000 [IU] | Freq: Three times a day (TID) | SUBCUTANEOUS | Status: DC
  Administered 2019-10-13: 7 [IU] via SUBCUTANEOUS
  Administered 2019-10-14 (×2): 4 [IU] via SUBCUTANEOUS
  Administered 2019-10-15: 7 [IU] via SUBCUTANEOUS
  Administered 2019-10-15 – 2019-10-16 (×2): 4 [IU] via SUBCUTANEOUS
  Administered 2019-10-16: 7 [IU] via SUBCUTANEOUS
  Administered 2019-10-16: 3 [IU] via SUBCUTANEOUS
  Administered 2019-10-17: 17:00:00 7 [IU] via SUBCUTANEOUS
  Administered 2019-10-17: 4 [IU] via SUBCUTANEOUS
  Administered 2019-10-18: 09:00:00 3 [IU] via SUBCUTANEOUS

## 2019-10-13 MED ORDER — VITAMIN C 500 MG PO TABS
500.0000 mg | ORAL_TABLET | Freq: Every day | ORAL | Status: DC
  Administered 2019-10-13: 09:00:00 500 mg via ORAL
  Filled 2019-10-13: qty 1

## 2019-10-13 MED ORDER — INSULIN DETEMIR 100 UNIT/ML ~~LOC~~ SOLN
25.0000 [IU] | Freq: Two times a day (BID) | SUBCUTANEOUS | Status: DC
  Administered 2019-10-13 – 2019-10-18 (×9): 25 [IU] via SUBCUTANEOUS
  Filled 2019-10-13 (×10): qty 0.25

## 2019-10-13 MED ORDER — DEXAMETHASONE SODIUM PHOSPHATE 10 MG/ML IJ SOLN: 10.0000 mg | INTRAMUSCULAR | Status: DC

## 2019-10-13 MED ORDER — POLYETHYLENE GLYCOL 3350 17 G PO PACK
17.0000 g | PACK | Freq: Every day | ORAL | Status: DC | PRN
  Administered 2019-10-14: 17 g via ORAL
  Filled 2019-10-13: qty 1

## 2019-10-13 MED ORDER — HYDROCOD POLST-CPM POLST ER 10-8 MG/5ML PO SUER: 5.0000 mL | Freq: Two times a day (BID) | ORAL | Status: DC | PRN

## 2019-10-13 MED ORDER — IPRATROPIUM-ALBUTEROL 20-100 MCG/ACT IN AERS
1.0000 | INHALATION_SPRAY | Freq: Four times a day (QID) | RESPIRATORY_TRACT | Status: DC
  Administered 2019-10-13 (×3): 1 via RESPIRATORY_TRACT
  Filled 2019-10-13: qty 4

## 2019-10-13 MED ORDER — ONDANSETRON HCL 4 MG/2ML IJ SOLN: 4.0000 mg | Freq: Four times a day (QID) | INTRAMUSCULAR | Status: DC | PRN

## 2019-10-13 MED ORDER — SODIUM CHLORIDE 0.9% FLUSH
3.0000 mL | Freq: Two times a day (BID) | INTRAVENOUS | Status: DC
  Administered 2019-10-13 – 2019-10-18 (×10): 3 mL via INTRAVENOUS

## 2019-10-13 MED ORDER — ENOXAPARIN SODIUM 40 MG/0.4ML ~~LOC~~ SOLN
40.0000 mg | SUBCUTANEOUS | Status: DC
  Administered 2019-10-13: 40 mg via SUBCUTANEOUS
  Filled 2019-10-13: qty 0.4

## 2019-10-13 MED ORDER — LOSARTAN POTASSIUM 50 MG PO TABS
25.0000 mg | ORAL_TABLET | Freq: Every day | ORAL | Status: DC
  Administered 2019-10-13: 09:00:00 25 mg via ORAL
  Filled 2019-10-13: qty 1

## 2019-10-13 MED ORDER — INSULIN ASPART 100 UNIT/ML ~~LOC~~ SOLN
0.0000 [IU] | Freq: Every day | SUBCUTANEOUS | Status: DC
  Administered 2019-10-13: 3 [IU] via SUBCUTANEOUS
  Administered 2019-10-14: 22:00:00 2 [IU] via SUBCUTANEOUS

## 2019-10-13 MED ORDER — ZINC SULFATE 220 (50 ZN) MG PO CAPS
220.0000 mg | ORAL_CAPSULE | Freq: Every day | ORAL | Status: DC
  Administered 2019-10-13 – 2019-10-18 (×6): 220 mg via ORAL
  Filled 2019-10-13 (×6): qty 1

## 2019-10-13 MED ORDER — VITAMIN C 500 MG PO TABS
500.0000 mg | ORAL_TABLET | Freq: Every day | ORAL | Status: DC
  Administered 2019-10-13 – 2019-10-18 (×6): 500 mg via ORAL
  Filled 2019-10-13 (×6): qty 1

## 2019-10-13 MED ORDER — ONDANSETRON HCL 4 MG PO TABS: 4.0000 mg | ORAL_TABLET | Freq: Four times a day (QID) | ORAL | Status: DC | PRN

## 2019-10-13 MED ORDER — GUAIFENESIN-DM 100-10 MG/5ML PO SYRP
5.0000 mL | ORAL_SOLUTION | ORAL | Status: DC | PRN
  Administered 2019-10-13: 5 mL via ORAL
  Filled 2019-10-13: qty 10

## 2019-10-13 NOTE — ED Notes (Signed)
Date and time results received: 10/13/19 12:56 AM  (use smartphrase ".now" to insert current time)  Test: Lactic  Critical Value: 2.7                                                                                                    Name of Provider Notified: Dr. Karma Greaser   Orders Received? Or Actions Taken?: No new orders

## 2019-10-13 NOTE — ED Notes (Signed)
Date and time results received: 10/13/19 3:28 AM (use smartphrase ".now" to insert current time)  Test: Lactic Critical Value: 2.1  Name of Provider Notified: Dr. Karma Greaser  Orders Received? Or Actions Taken?: No new orders at this time

## 2019-10-13 NOTE — ED Notes (Signed)
Pt's wife cathy updated on pt status

## 2019-10-13 NOTE — Progress Notes (Signed)
Hunter Peters is currently on enoxaparin for a diagnosis of DVT prophylaxis associated with Covid.  Current Labs: Hematology Lab Results  Component Value Date/Time   WBC 8.6 10/13/2019 09:01 AM   RBC 4.65 10/13/2019 09:01 AM   HGB 13.8 10/13/2019 09:01 AM   HGB 15.3 08/07/2019 10:31 AM   HCT 39.1 10/13/2019 09:01 AM   HCT 45.0 08/07/2019 10:31 AM   MCV 84.1 10/13/2019 09:01 AM   MCV 90 08/07/2019 10:31 AM   MCH 29.7 10/13/2019 09:01 AM   Platelets  Date Value Ref Range Status  10/13/2019 500 (H) 150 - 400 K/uL Final  08/07/2019 340 150 - 450 x10E3/uL Final   No results found for: PTT No results found for: INR  Patient is a 68yo male that is Covid+. Has an elevated D-dimer. Pharmacy consulted to dose Lovenox for prophylaxis.  Will order intermediate dose Lovenox 40mg  SQ q12h per Covid algorithm.  Paulina Fusi, PharmD, BCPS 10/13/2019 10:07 AM

## 2019-10-13 NOTE — H&P (Signed)
History and Physical    Hunter Peters [id number] DOB: 12-May-1951 DOA: 10/12/2019  PCP: Guadalupe Maple, MD  Patient coming from: Home  I have personally briefly reviewed patient's old medical records in [place]  Chief Complaint: Increasing shortness of breath with recent COVID infection  HPI: Hunter Peters is a 68 y.o. male with medical history significant of CAD, type 2 diabetes, hyperlipidemia and recent Covid positive infection who presents with concerns of increasing shortness of breath.  Patient reports that about 2 weeks ago he traveled by plane to [place] for a golfing trip.  Shortly after he felt extreme fatigue and generalized weakness and stayed in bed for 5 days.  He was tested positive for COVID on 11/16 along with his wife. States all his golfing friends are now positive as well.   For the past few days he also started to note shortness of breath worse when laying down and with exertion. He saw his PCP on 11/19 and was started on steroids and started on Acyclovir for possible herpes zoster since he was noting some tingling and pain to his left frontal region and has hx of shingles in the same area. He felt the antiviral was giving him a worse headache but thought it was still helping him to get better.   He then brought a pulse ox at home and noted his oxygen saturation to be at 70% for several days and he finally decided to call EMS when he noted his oxygen saturation to be in the 60s today. Also otes decrease appetite and diarrhea after most meals for the past 2 weeks and about 14lb weight loss.No nausea or vomiting or abdominal pain. Endorse occasional cigar use.  Denies alcohol or illicit drug use.  Upon EMS arrival, he was found to have oxygen saturation of 78% on room air and had to be placed on nonrebreather with improvement of oxygen saturation up to 94%.  Since then, he has been weaned down to 6 L via nasal cannula with oxygen saturation at 91%.  He was started on  IV Decadron and remdesivir by ED physician.  Hospitalist being called for admission here since, [facility] is [facility] at full [facility].  CBC shows no leukocytosis or anemia.  CMP showed no sodium or potassium abnormalities.  Glucose of 290.  AST mildly elevated at 44.  BNP of 116.  Troponin of 5.  LDH of 466, ferritin of 1295, lactic acid of 2.7.  Procalcitonin of less than 0.10. Chest x-ray shows multifocal area of interstitial and airspace opacity throughout both lungs compatible with multifocal pneumonia.  Review of Systems:  Constitutional: No Weight Change, No Fever ENT/Mouth: No sore throat, No Rhinorrhea Eyes: No Eye Pain, No Vision Changes Cardiovascular: No Chest Pain, +SOB, No PND, + Dyspnea on Exertion, No Orthopnea Respiratory: No Cough, No Sputum, No Wheezing, no Dyspnea  Gastrointestinal: No Nausea, No Vomiting, No Diarrhea, No Constipation, No Pain Genitourinary: no Urinary Incontinence, No Urgency, No Flank Pain Musculoskeletal: No Arthralgias, No Myalgias Skin: No Skin Lesions, No Pruritus, Neuro: no Weakness, No Numbness,  No Loss of Consciousness, No Syncope Psych: No Anxiety/Panic, No Depression, no decrease appetite Heme/Lymph: No Bruising, No Bleeding  Past Medical History:  Diagnosis Date   Diabetes mellitus without complication (HCC)     Past Surgical History:  Procedure Laterality Date   Eyelid Surgery Bilateral    INTRAOCULAR LENS INSERTION Left 7/11   KNEE ARTHROSCOPY Right      reports that  he has been smoking cigars. He has never used smokeless tobacco. He reports current alcohol use. He reports that he does not use drugs.  Allergies  Allergen Reactions   Sulfonamide Derivatives     REACTION: GI Intolerance    Family History  Problem Relation Age of Onset   Alzheimer's disease Mother    Hypertension Father      Prior to Admission medications   Medication Sig Start Date End Date Taking? Authorizing Provider  betamethasone dipropionate  (DIPROLENE) 0.05 % cream Apply topically 2 (two) times daily. 03/30/16   Guadalupe Maple, MD  fluorometholone (FML) 0.1 % ophthalmic suspension 1 drop daily. 01/27/16   [provider]  losartan (COZAAR) 25 MG tablet Take 1 tablet (25 mg total) by mouth daily. 10/09/19 01/07/20  Park Liter P, DO  metFORMIN (GLUCOPHAGE) 500 MG tablet Take 2 tablets (1,000 mg total) by mouth 2 (two) times daily with a meal. 10/09/19   Johnson, Megan P, DO  predniSONE (DELTASONE) 10 MG tablet 6 tabs today and tomorrow, 5 tabs the next 2 days, decrease by 1 every other until gone. 10/09/19   Johnson, Megan P, DO  rosuvastatin (CRESTOR) 10 MG tablet Take 1 tablet (10 mg total) by mouth daily. 10/09/19   Johnson, Megan P, DO  valACYclovir (VALTREX) 1000 MG tablet Take 1 tablet (1,000 mg total) by mouth 2 (two) times daily. 10/09/19   Valerie Roys, DO    Physical Exam: Vitals:   10/13/19 0004 10/13/19 0030 10/13/19 0100 10/13/19 0108  BP:  128/77  120/82  Pulse: 77 77 78 74  Resp: (!) 24 (!) 29 (!) 21 20  Temp:      TempSrc:      SpO2: 92% (!) 88% 92% 90%  Weight:      Height:        Constitutional: NAD, calm, comfortable, nontoxic appearing male laying flat in bed Vitals:   10/13/19 0004 10/13/19 0030 10/13/19 0100 10/13/19 0108  BP:  128/77  120/82  Pulse: 77 77 78 74  Resp: (!) 24 (!) 29 (!) 21 20  Temp:      TempSrc:      SpO2: 92% (!) 88% 92% 90%  Weight:      Height:       Eyes: PERRL, lids and conjunctivae normal ENMT: Mucous membranes are moist.  Neck: normal, supple, no masses,  Respiratory: clear to auscultation bilaterally, no wheezing, no crackles. Normal respiratory effort on 6 L via nasal cannula with oxygen saturation of 91%. No accessory muscle use.  Cardiovascular: Regular rate and rhythm, no murmurs / rubs / gallops. No extremity edema. 2+ pedal pulses..  Abdomen: no tenderness, no masses palpated.  Bowel sounds positive.  Musculoskeletal: no clubbing / cyanosis. No  joint deformity upper and lower extremities. Good ROM, no contractures. Normal muscle tone.  Skin: no rashes, lesions, ulcers. No induration Neurologic: CN 2-12 grossly intact. Sensation intact. Strength 5/5 in all 4.  Psychiatric: Normal judgment and insight. Alert and oriented x 3. Normal mood.     Labs on Admission: I have personally reviewed following labs and imaging studies  CBC: Recent Labs  Lab 10/12/19 2346  WBC 7.4  NEUTROABS 6.6  HGB 13.8  HCT 40.4  MCV 86.5  PLT [id number]*   Basic Metabolic Panel: Recent Labs  Lab 10/12/19 2346  NA 135  K 3.9  CL 100  CO2 22  GLUCOSE 290*  BUN 21  CREATININE 0.78  CALCIUM 8.0*  GFR: Estimated Creatinine Clearance: 105.2 mL/min (by C-G formula based on SCr of 0.78 mg/dL). Liver Function Tests: Recent Labs  Lab 10/12/19 2346  AST 44*  ALT 44  ALKPHOS 55  BILITOT 0.8  PROT 7.0  ALBUMIN 2.7*   No results for input(s): LIPASE, AMYLASE in the last 168 hours. No results for input(s): AMMONIA in the last 168 hours. Coagulation Profile: No results for input(s): INR, PROTIME in the last 168 hours. Cardiac Enzymes: No results for input(s): CKTOTAL, CKMB, CKMBINDEX, TROPONINI in the last 168 hours. BNP (last 3 results) No results for input(s): PROBNP in the last 8760 hours. HbA1C: No results for input(s): HGBA1C in the last 72 hours. CBG: No results for input(s): GLUCAP in the last 168 hours. Lipid Profile: Recent Labs    10/12/19 2346  TRIG 143   Thyroid Function Tests: No results for input(s): TSH, T4TOTAL, FREET4, T3FREE, THYROIDAB in the last 72 hours. Anemia Panel: Recent Labs    10/12/19 2346  FERRITIN 1,295*   Urine analysis:    Component Value Date/Time   APPEARANCEUR Clear 08/07/2019 1029   GLUCOSEU Negative 08/07/2019 1029   BILIRUBINUR Negative 08/07/2019 1029   PROTEINUR Negative 08/07/2019 1029   NITRITE Negative 08/07/2019 1029   LEUKOCYTESUR Negative 08/07/2019 1029    Radiological Exams on  Admission: Dg Chest Port 1 View  Result Date: 10/13/2019 CLINICAL DATA:  COVID-19, worsening dyspnea EXAM: PORTABLE CHEST 1 VIEW COMPARISON:  Coronary CT 09/04/2019, chest radiograph 05/17/2010 FINDINGS: Multifocal areas of interstitial and airspace opacity throughout both lungs with diminished volumes and streaky basilar areas of subsegmental atelectasis. No pneumothorax or effusion. Cardiomediastinal prominence likely accentuated by portable technique. No acute osseous or soft tissue abnormality. Degenerative changes are present in the imaged spine and shoulders. IMPRESSION: Multifocal areas of interstitial and airspace opacity throughout both lungs, compatible with multifocal pneumonia. Electronically Signed   By: Lovena Le M.D.   On: 10/13/2019 00:18    EKG: Independently reviewed.   Assessment/Plan Acute hypoxic respiratory failure in the setting of COVID multifocal pneumonia -Patient had recent travel out of state-wife and friends also positive. Positive test on 11/16 - all inflammatory markers are elevated but PCT is low so does not suspect superimposed bacterial pneumonia. - continue IV decadron - continue remdesivir  Type 2 diabetes -Moderate sliding scale while on steroid -Continue Losartan  Hyperlipidemia -Continue statin   DVT prophylaxis:.Lovenox Code Status:Full Family Communication: Plan discussed with patient at bedside  disposition Plan: Home with at least 2 midnight stays  Consults called:  Admission status: inpatient-patient will remain at [facility] for the time being given that [facility] is now at full capacity   [facility] P  If 7PM-7AM, please contact night-coverage [email or web address] Password [email or web address]  10/13/2019, 1:48 AM

## 2019-10-13 NOTE — ED Notes (Addendum)
Per RN Annie Main, Md Karma Greaser is aware of pt's O2 sats staying between 85%-88% on 6L [place]. Pt is in NAD.  Pt states he feels less short of breath now with the oxygen.

## 2019-10-13 NOTE — ED Notes (Signed)
Report given to [facility] with [facility]. ETA 15 minutes

## 2019-10-13 NOTE — H&P (Signed)
History and Physical  Hunter Peters [id number] DOB: 10-Aug-1951 DOA: 10/13/2019  PCP: Guadalupe Maple, MD Patient coming from: [facility]  I have personally briefly reviewed patient's old medical records in [place]   Chief Complaint: Shortness of breath and fatigue  HPI: Hunter Peters is a 68 y.o. male past medical history of CAD, diabetes mellitus type 2 who tested Positive for COVID-19 on 10/06/2019 he relates he started having shortness of breath about a week ago that is why he got tested.  He relates he traveled to [place] for a golfing trip afterwards felt extremely fatigued and generalized weakness for 5 days.  He saw his PCP on 10/09/2019 and started him on steroids and acyclovir for possible herpes zoster since he had some tingling on his left frontal thoracic region.  Had a pulse ox at home and his saturations were 70% on room air he decided to called EMS where he was taken to the ED and it was confirmed that he was hypoxic in the ED he was placed on 6 L and saturations improved to 94%.  He was started on IV Decadron on remdesivir.  In the ED at [place]: His CRP was 13 D-dimer is pending he was started on IV remdesivir and steroids.  Because elevated LFTs within normal limits.  Lactic acid was 2.1 his procalcitonin was less than 0.1.  Review of Systems: All systems reviewed and apart from history of presenting illness, are negative.  Past Medical History:  Diagnosis Date  . Diabetes mellitus without complication [facility])    Past Surgical History:  Procedure Laterality Date  . Eyelid Surgery Bilateral   . INTRAOCULAR LENS INSERTION Left 7/11  . KNEE ARTHROSCOPY Right    Social History:  reports that he has been smoking cigars. He has never used smokeless tobacco. He reports current alcohol use. He reports that he does not use drugs.   Allergies  Allergen Reactions  . Sulfonamide Derivatives     REACTION: GI Intolerance    Family History  Problem Relation Age of  Onset  . Alzheimer's disease Mother   . Hypertension Father     Prior to Admission medications   Medication Sig Start Date End Date Taking? Authorizing Provider  betamethasone dipropionate (DIPROLENE) 0.05 % cream Apply topically 2 (two) times daily. Patient taking differently: Apply topically 2 (two) times daily as needed.  03/30/16   Guadalupe Maple, MD  losartan (COZAAR) 25 MG tablet Take 1 tablet (25 mg total) by mouth daily. 10/09/19 01/07/20  Park Liter P, DO  metFORMIN (GLUCOPHAGE) 500 MG tablet Take 2 tablets (1,000 mg total) by mouth 2 (two) times daily with a meal. 10/09/19   Johnson, Megan P, DO  predniSONE (DELTASONE) 10 MG tablet 6 tabs today and tomorrow, 5 tabs the next 2 days, decrease by 1 every other until gone. 10/09/19   Johnson, Megan P, DO  rosuvastatin (CRESTOR) 10 MG tablet Take 1 tablet (10 mg total) by mouth daily. 10/09/19   Johnson, Megan P, DO  valACYclovir (VALTREX) 1000 MG tablet Take 1 tablet (1,000 mg total) by mouth 2 (two) times daily. 10/09/19   Valerie Roys, DO   Physical Exam: There were no vitals filed for this visit.   General exam: Moderately built and nourished patient, lying comfortably supine on the gurney in no obvious distress.  Head, eyes and ENT: Nontraumatic and normocephalic. Pupils equally reacting to light and accommodation. Oral mucosa moist.  Neck: Supple. No JVD,  carotid bruit or thyromegaly.  Lymphatics: No lymphadenopathy.  Respiratory system: Good air movement with crackles bilaterally at both bases.  Cardiovascular system: S1 and S2 heard, RRR. No JVD, murmurs, gallops, clicks or pedal edema.  Gastrointestinal system: Abdomen is nondistended, soft and nontender. Normal bowel sounds heard. No organomegaly or masses appreciated.  Central nervous system: Alert and oriented. No focal neurological deficits.  Extremities: Symmetric 5 x 5 power. Peripheral pulses symmetrically felt.   Skin: No rashes or acute findings.   Musculoskeletal system: Negative exam.  Psychiatry: Pleasant and cooperative.   Labs on Admission:  Basic Metabolic Panel: Recent Labs  Lab 10/12/19 2346 10/13/19 0901  NA 135 137  K 3.9 4.1  CL 100 103  CO2 22 22  GLUCOSE 290* 274*  BUN 21 21  CREATININE 0.78 0.74  CALCIUM 8.0* 7.6*  MG  --  2.9*   Liver Function Tests: Recent Labs  Lab 10/12/19 2346 10/13/19 0901  AST 44* 35  ALT 44 40  ALKPHOS 55 52  BILITOT 0.8 0.9  PROT 7.0 7.0  ALBUMIN 2.7* 2.7*   No results for input(s): LIPASE, AMYLASE in the last 168 hours. No results for input(s): AMMONIA in the last 168 hours. CBC: Recent Labs  Lab 10/12/19 2346 10/13/19 0901  WBC 7.4 8.6  NEUTROABS 6.6 7.8*  HGB 13.8 13.8  HCT 40.4 39.1  MCV 86.5 84.1  PLT 512* 500*   Cardiac Enzymes: No results for input(s): CKTOTAL, CKMB, CKMBINDEX, TROPONINI in the last 168 hours.  BNP (last 3 results) No results for input(s): PROBNP in the last 8760 hours. CBG: Recent Labs  Lab 10/13/19 0719 10/13/19 0859 10/13/19 1432  GLUCAP 269* 252* 213*    Radiological Exams on Admission: Dg Chest Port 1 View  Result Date: 10/13/2019 CLINICAL DATA:  COVID-19, worsening dyspnea EXAM: PORTABLE CHEST 1 VIEW COMPARISON:  Coronary CT 09/04/2019, chest radiograph 05/17/2010 FINDINGS: Multifocal areas of interstitial and airspace opacity throughout both lungs with diminished volumes and streaky basilar areas of subsegmental atelectasis. No pneumothorax or effusion. Cardiomediastinal prominence likely accentuated by portable technique. No acute osseous or soft tissue abnormality. Degenerative changes are present in the imaged spine and shoulders. IMPRESSION: Multifocal areas of interstitial and airspace opacity throughout both lungs, compatible with multifocal pneumonia. Electronically Signed   By: Lovena Le M.D.   On: 10/13/2019 00:18    EKG: Independently reviewed.  None  Assessment/Plan Acute respiratory failure with hypoxia  due to Pneumonia due to COVID-19 virus Currently requiring 4 L of oxygen to keep saturations greater than 94%. I agree with continuing IV remdesivir and steroids. Continue vitamin C and zinc.  Check a CRP, D-dimer CBC and a complete metabolic panel.  We will follow his inflammatory markers closely. His procalcitonin was less than 0.1 lactic acid is 2.1 there is unlikely sepsis likely due to hypoxia. We will try to keep the patient peripherally 16 hours a day, monitor strict I's and O's and daily weights. I have talked to him extensively about the medications use for COVID-19. I will also talk to him about Actemra. The treatment plan and use of medications and known side effects were discussed with patient/family, they were clearly explained that there is no proven definitive treatment for COVID-19 infection, any medications used here are based on published clinical articles/anecdotal data which are not peer-reviewed or randomized control trials.  Complete risks and long-term side effects are unknown, however in the best clinical judgment they seem to be of some clinical benefit  rather than medical risks.  Patient/family agree with the treatment plan and want to receive the given medications.  Diabetes mellitus type 2 without complications: We will discontinue his Metformin. He is being placed on steroids which will elevate his blood glucose, will start him on long-acting insulin twice a day plus sliding scale resistant. We will give him carb modified diet.  Hyperlipidemia Continue statin therapy.    DVT Prophylaxis: lovenox Code Status: full  Family Communication: none  Disposition Plan: once off oxygen   Time spent: 70 min    It is my clinical opinion that admission to INPATIENT is reasonable and necessary in this 68 y.o. male who presents with shortness of breath and dyspnea on exertion with symptoms concerning for COVID-19 viral pneumonia was found to be hypoxic in the 70s with a past  medical history and risk factors of diabetes mellitus type 2 and hypertension and, chest x-ray showing a new infiltrates.   Given the aforementioned, the predictability of an adverse outcome is felt to be significant. I expect that the patient will require at least 2 midnights in the hospital to treat this condition.  Charlynne Cousins MD [facility]   10/13/2019, 6:15 PM

## 2019-10-13 NOTE — ED Notes (Signed)
[facility] at bedside 

## 2019-10-13 NOTE — ED Notes (Signed)
Pt oxygen sats 88% on 6L [place].  MD notified.  No new orders or changes at this time.  Will continue to monitor patient vitals and respiratory status.

## 2019-10-13 NOTE — ED Notes (Signed)
Called [employer] again and informed them that we had not received the lunch tray for pt and he is needing his insulin and BS checked. Per [employer] the ticket is out there and he should get it soon.

## 2019-10-13 NOTE — Discharge Summary (Signed)
[facility] Discharge Summary   Patient: Hunter Peters [id number]   PCP: Guadalupe Maple, MD DOB: 12/05/1950   Date of admission: 10/12/2019   Date of discharge:  10/13/2019    Discharge Diagnoses:  Principal diagnosis Pneumonia due to COVID-19 virus    Acute respiratory failure ([place])   Type 2 diabetes mellitus with hyperlipidemia ([place])   Admitted From: home Disposition:  Outside Hospital [facility]  Recommendations for Outpatient Follow-up:  Once discharge from [place] recommendation: Cardiac diet  Activity: The patient is advised to gradually reintroduce usual activities,as tolerated.  Discharge Condition: good  Code Status: Full code   History of present illness: As per the H and P dictated on admission, "Hunter Peters is a 68 y.o. male with medical history significant of CAD, type 2 diabetes, hyperlipidemia and recent Covid positive infection who presents with concerns of increasing shortness of breath.  Patient reports that about 2 weeks ago he traveled by plane to [place] for a golfing trip.  Shortly after he felt extreme fatigue and generalized weakness and stayed in bed for 5 days.  He was tested positive for COVID on 11/16 along with his wife. States all his golfing friends are now positive as well.   For the past few days he also started to note shortness of breath worse when laying down and with exertion. He saw his PCP on 11/19 and was started on steroids and started on Acyclovir for possible herpes zoster since he was noting some tingling and pain to his left frontal region and has hx of shingles in the same area. He felt the antiviral was giving him a worse headache but thought it was still helping him to get better.   He then brought a pulse ox at home and noted his oxygen saturation to be at 70% for several days and he finally decided to call EMS when he noted his oxygen saturation to be in the 60s today. Also otes decrease appetite and diarrhea after most meals  for the past 2 weeks and about 14lb weight loss.No nausea or vomiting or abdominal pain. Endorse occasional cigar use.  Denies alcohol or illicit drug use.  Upon EMS arrival, he was found to have oxygen saturation of 78% on room air and had to be placed on nonrebreather with improvement of oxygen saturation up to 94%.  Since then, he has been weaned down to 6 L via nasal cannula with oxygen saturation at 91%.  He was started on IV Decadron and remdesivir by ED physician.  Hospitalist being called for admission here since, [name] is now at full capacity."  Hospital Course:  Summary of his active problems in the hospital is as following. Acute COVID-19 Viral illness Lab Results  Component Value Date   SARSCOV2NAA Detected (A) 10/06/2019   CXR: hazy bilateral peripheral opacities  Recent Labs    10/12/19 2346 10/13/19 0901  FERRITIN 1,295* 1,321*  LDH 466*  --   CRP 13.7* 12.1*    Tmax last 24 hours:  Temp (24hrs), Avg:98.9 F (37.2 C), Min:98.9 F (37.2 C), Max:98.9 F (37.2 C)   Oxygen requirements: 6LPM  Antibiotics:none Diuretics: none Vitamin C and Zinc: started on 10/13/2019 DVT Prophylaxis: Subcutaneous Lovenox weight based. Remdesivir: started on 10/12/2019 Steroids: started decadron on 10/12/2019 Actemra: nor received off-label use of Actemra  Prone positioning: Patient encouraged to stay in prone position as much as possible.  PPE During this encounter: Patient Isolation: Airborne + Droplet + Contact HCP  PPE: CAPR, gown. gloves Patient PPE: None  The treatment plan and use of medications and known side effects were discussed with patient/family. It was clearly explained that there is no proven definitive treatment for COVID-19 infection yet. Any medications used here are based on case reports/anecdotal data which are not peer-reviewed and has not been studied using randomized control trials.  Complete risks and long-term side effects are unknown, however in  the best clinical judgment they seem to be of some clinical benefit rather than medical risks.  Patient/family agree with the treatment plan and want to receive these treatments as indicated.   Type 2 DM uncontrolled with steroids hyperglycemia  On sliding scale  Metformin on hold  Hyperlipidemia  Continuing Crestor  Patient was ambulatory without any assistance. On the day of the discharge the patient's vitals were stable, and no other acute medical condition were reported by patient. the patient was felt safe to be discharge at [facility] with no therapy needed on discharge.  Consultants: none Procedures: none  DISCHARGE MEDICATION: Allergies as of 10/13/2019      Reactions   Sulfonamide Derivatives    REACTION: GI Intolerance      Medication List    TAKE these medications   betamethasone dipropionate 0.05 % cream Apply topically 2 (two) times daily. What changed:   when to take this  reasons to take this   losartan 25 MG tablet Commonly known as: COZAAR Take 1 tablet (25 mg total) by mouth daily.   metFORMIN 500 MG tablet Commonly known as: GLUCOPHAGE Take 2 tablets (1,000 mg total) by mouth 2 (two) times daily with a meal.   predniSONE 10 MG tablet Commonly known as: DELTASONE 6 tabs today and tomorrow, 5 tabs the next 2 days, decrease by 1 every other until gone.   rosuvastatin 10 MG tablet Commonly known as: Crestor Take 1 tablet (10 mg total) by mouth daily.   valACYclovir 1000 MG tablet Commonly known as: VALTREX Take 1 tablet (1,000 mg total) by mouth 2 (two) times daily.      Allergies  Allergen Reactions  . Sulfonamide Derivatives     REACTION: GI Intolerance   Discharge Instructions    Diet - low sodium heart healthy   Complete by: As directed    Increase activity slowly   Complete by: As directed      Discharge Exam: Filed Weights   10/12/19 2341 10/13/19 0842  Weight: 95.3 kg 95.3 kg   Vitals:   10/13/19 1526 10/13/19 1527   BP:    Pulse: 91 91  Resp: (!) 22 (!) 29  Temp:    SpO2: 90% 90%   General: Appear in marked distress, no Rash; Oral Mucosa Clear, moist. no Abnormal Mass Or lumps Cardiovascular: S1 and S2 Present, no Murmur, Respiratory: increased respiratory effort, Bilateral Air entry present and bilateral  Crackles, no wheezes Abdomen: Bowel Sound present, Soft and no tenderness, no hernia Extremities: no Pedal edema, no calf tenderness Neurology: alert and oriented to time, place, and person affect appropriate.  The results of significant diagnostics from this hospitalization (including imaging, microbiology, ancillary and laboratory) are listed below for reference.    Significant Diagnostic Studies: Dg Chest Port 1 View  Result Date: 10/13/2019 CLINICAL DATA:  COVID-19, worsening dyspnea EXAM: PORTABLE CHEST 1 VIEW COMPARISON:  Coronary CT 09/04/2019, chest radiograph 05/17/2010 FINDINGS: Multifocal areas of interstitial and airspace opacity throughout both lungs with diminished volumes and streaky basilar areas of subsegmental atelectasis. No pneumothorax or  effusion. Cardiomediastinal prominence likely accentuated by portable technique. No acute osseous or soft tissue abnormality. Degenerative changes are present in the imaged spine and shoulders. IMPRESSION: Multifocal areas of interstitial and airspace opacity throughout both lungs, compatible with multifocal pneumonia. Electronically Signed   By: Lovena Le M.D.   On: 10/13/2019 00:18    Microbiology: Recent Results (from the past 240 hour(s))  Novel Coronavirus, NAA ([facility])     Status: Abnormal   Collection Time: 10/06/19 12:43 PM   Specimen: Nasopharyngeal(NP) swabs in vial transport medium   NASOPHARYNGE  TESTING  Result Value Ref Range Status   SARS-CoV-2, NAA Detected (A) Not Detected Final    Comment: This nucleic acid amplification test was developed and its performance characteristics determined by [employer].  Nucleic acid amplification tests include PCR and TMA. This test has not been FDA cleared or approved. This test has been authorized by FDA under an Emergency Use Authorization (EUA). This test is only authorized for the duration of time the declaration that circumstances exist justifying the authorization of the emergency use of in vitro diagnostic tests for detection of SARS-CoV-2 virus and/or diagnosis of COVID-19 infection under section 564(b)(1) of the Act, 21 U.S.C. [id number]) (1), unless the authorization is terminated or revoked sooner. When diagnostic testing is negative, the possibility of a false negative result should be considered in the context of a patient's recent exposures and the presence of clinical signs and symptoms consistent with COVID-19. An individual without symptoms of COVID-19 and who is not shedding SARS-CoV-2 virus would  expect to have a negative (not detected) result in this assay.      Labs: CBC: Recent Labs  Lab 10/12/19 2346 10/13/19 0901  WBC 7.4 8.6  NEUTROABS 6.6 7.8*  HGB 13.8 13.8  HCT 40.4 39.1  MCV 86.5 84.1  PLT 512* [id number]*   Basic Metabolic Panel: Recent Labs  Lab 10/12/19 2346 10/13/19 0901  NA 135 137  K 3.9 4.1  CL 100 103  CO2 22 22  GLUCOSE 290* 274*  BUN 21 21  CREATININE 0.78 0.74  CALCIUM 8.0* 7.6*  MG  --  2.9*   Liver Function Tests: Recent Labs  Lab 10/12/19 2346 10/13/19 0901  AST 44* 35  ALT 44 40  ALKPHOS 55 52  BILITOT 0.8 0.9  PROT 7.0 7.0  ALBUMIN 2.7* 2.7*   No results for input(s): LIPASE, AMYLASE in the last 168 hours. No results for input(s): AMMONIA in the last 168 hours. Cardiac Enzymes: No results for input(s): CKTOTAL, CKMB, CKMBINDEX, TROPONINI in the last 168 hours. BNP (last 3 results) Recent Labs    10/12/19 2346  BNP 116.0*   CBG: Recent Labs  Lab 10/13/19 0719 10/13/19 0859 10/13/19 1432  GLUCAP 269* 252* 213*    Time spent: 35 minutes  Signed:  Berle Mull   [facility]  10/13/2019 4:21 PM

## 2019-10-13 NOTE — Progress Notes (Signed)
Remdesivir - Pharmacy Brief Note   O:  ALT: 44 CXR: IMPRESSION: Multifocal areas of interstitial and airspace opacity throughout both lungs, compatible with multifocal pneumonia. SpO2: 88% on 6L    A/P:  Remdesivir 200 mg IVPB once followed by 100 mg IVPB daily x 4 days.   Tobie Lords, PharmD, BCPS Clinical Pharmacist 10/13/2019 1:03 AM

## 2019-10-13 NOTE — ED Notes (Signed)
Pt's wife Baraka Parmeter would like updates- [phone]

## 2019-10-13 NOTE — ED Notes (Signed)
Admitting Md at bedside

## 2019-10-13 NOTE — Progress Notes (Signed)
Late Entry: Patient arrived from [facility] via carelink. Patient alert and oriented x4, denies pain, discomfort. No signs of distress. Patient able to ambulate from stretcher to bed. Patient on 6L [place]. All unit protocols followed - chlorhex wipes, MRSA swab, etc. Patient placed on monitor, central telemetry unit notified. Dr. Aileen Fass at the bedside for assessment.  Patient ambulated from bed to recliner, call light given. Patient oriented to call light, bed controls, room, etc. Roselyn Reef Heitor Steinhoff,RN

## 2019-10-13 NOTE — ED Notes (Signed)
Called Roselyn Reef and let her know pt is on his way

## 2019-10-13 NOTE — ED Notes (Signed)
Chest xray at bedside.

## 2019-10-13 NOTE — ED Notes (Signed)
Pt given water and breakfast tray. Pt repositioned in bed.

## 2019-10-13 NOTE — ED Notes (Signed)
Pt given meal tray and apple juice

## 2019-10-13 NOTE — ED Notes (Signed)
Called [facility] to check on pt's lunch tray. [facility] stated they will send up meal tray for pt and apologized for pt not getting one.

## 2019-10-13 NOTE — ED Notes (Signed)
Consent to sign paper copy in chart

## 2019-10-14 ENCOUNTER — Encounter (HOSPITAL_COMMUNITY): Payer: Self-pay

## 2019-10-14 LAB — COMPREHENSIVE METABOLIC PANEL
ALT: 31 U/L (ref 0–44)
AST: 29 U/L (ref 15–41)
Albumin: 2.6 g/dL — ABNORMAL LOW (ref 3.5–5.0)
Alkaline Phosphatase: 48 U/L (ref 38–126)
Anion gap: 11 (ref 5–15)
BUN: 25 mg/dL — ABNORMAL HIGH (ref 8–23)
CO2: 25 mmol/L (ref 22–32)
Calcium: 7.8 mg/dL — ABNORMAL LOW (ref 8.9–10.3)
Chloride: 102 mmol/L (ref 98–111)
Creatinine, Ser: 0.91 mg/dL (ref 0.61–1.24)
GFR calc Af Amer: >60 mL/min (ref >60)
GFR calc non Af Amer: >60 mL/min (ref >60)
Glucose, Bld: 155 mg/dL — ABNORMAL HIGH (ref 70–99)
Potassium: 4.5 mmol/L (ref 3.5–5.1)
Sodium: 138 mmol/L (ref 135–145)
Total Bilirubin: 0.7 mg/dL (ref 0.3–1.2)
Total Protein: 6.4 g/dL — ABNORMAL LOW (ref 6.5–8.1)

## 2019-10-14 LAB — CBC WITH DIFFERENTIAL/PLATELET
Abs Immature Granulocytes: 0.15 10*3/uL — ABNORMAL HIGH (ref 0.00–0.07)
Basophils Absolute: 0 10*3/uL (ref 0.0–0.1)
Basophils Relative: 0 %
Eosinophils Absolute: 0 10*3/uL (ref 0.0–0.5)
Eosinophils Relative: 0 %
HCT: 40.4 % (ref 39.0–52.0)
Hemoglobin: 13.4 g/dL (ref 13.0–17.0)
Immature Granulocytes: 1 %
Lymphocytes Relative: 4 %
Lymphs Abs: 0.5 10*3/uL — ABNORMAL LOW (ref 0.7–4.0)
MCH: 29.9 pg (ref 26.0–34.0)
MCHC: 33.2 g/dL (ref 30.0–36.0)
MCV: 90.2 fL (ref 80.0–100.0)
Monocytes Absolute: 0.2 10*3/uL (ref 0.1–1.0)
Monocytes Relative: 2 %
Neutro Abs: 10.6 10*3/uL — ABNORMAL HIGH (ref 1.7–7.7)
Neutrophils Relative %: 93 %
Platelets: 513 10*3/uL — ABNORMAL HIGH (ref 150–400)
RBC: 4.48 MIL/uL (ref 4.22–5.81)
RDW: 13.9 % (ref 11.5–15.5)
WBC: 11.4 10*3/uL — ABNORMAL HIGH (ref 4.0–10.5)
nRBC: 0.4 % — ABNORMAL HIGH (ref 0.0–0.2)

## 2019-10-14 LAB — ABO/RH: ABO/RH(D): A NEG

## 2019-10-14 LAB — C-REACTIVE PROTEIN: CRP: 6.9 mg/dL — ABNORMAL HIGH (ref <1.0)

## 2019-10-14 LAB — HEMOGLOBIN A1C
Hgb A1c MFr Bld: 8.2 % — ABNORMAL HIGH (ref 4.8–5.6)
Mean Plasma Glucose: 188.64 mg/dL

## 2019-10-14 LAB — GLUCOSE, CAPILLARY
Glucose-Capillary: 171 mg/dL — ABNORMAL HIGH (ref 70–99)
Glucose-Capillary: 167 mg/dL — ABNORMAL HIGH (ref 70–99)
Glucose-Capillary: 104 mg/dL — ABNORMAL HIGH (ref 70–99)
Glucose-Capillary: 228 mg/dL — ABNORMAL HIGH (ref 70–99)

## 2019-10-14 LAB — D-DIMER, QUANTITATIVE: D-Dimer, Quant: 7.03 ug/mL-FEU — ABNORMAL HIGH (ref 0.00–0.50)

## 2019-10-14 MED ORDER — ENOXAPARIN SODIUM 40 MG/0.4ML ~~LOC~~ SOLN
40.0000 mg | Freq: Two times a day (BID) | SUBCUTANEOUS | Status: DC
  Administered 2019-10-14 – 2019-10-16 (×5): 40 mg via SUBCUTANEOUS
  Filled 2019-10-14 (×5): qty 0.4

## 2019-10-14 MED ORDER — SODIUM CHLORIDE 0.9 % IV SOLN
100.0000 mg | INTRAVENOUS | Status: AC
  Administered 2019-10-15 – 2019-10-16 (×2): 100 mg via INTRAVENOUS
  Filled 2019-10-14: qty 20
  Filled 2019-10-14: qty 100

## 2019-10-14 MED ORDER — TOCILIZUMAB 400 MG/20ML IV SOLN
8.0000 mg/kg | Freq: Once | INTRAVENOUS | Status: AC
  Administered 2019-10-14: 762 mg via INTRAVENOUS
  Filled 2019-10-14: qty 38.1

## 2019-10-14 MED ORDER — SODIUM CHLORIDE 0.9 % IV SOLN
100.0000 mg | Freq: Once | INTRAVENOUS | Status: AC
  Administered 2019-10-14: 14:00:00 100 mg via INTRAVENOUS
  Filled 2019-10-14: qty 20

## 2019-10-14 NOTE — Plan of Care (Signed)
Pt slept intermittently during the night, refused sleeping medication for now. No complaints of pain verbalized. Alert and oriented. Vitals stable on 6L [place], humidified sats 88-94%. Minimal dyspnea on exertion and productive cough noted, prn Robitussin given. Standby assist with ADLs. IV SL. [place]t to reposition independently in bed but unable to tolerate prone positioning overnight. No other issues, will monitor.   Problem: Education: Goal: Knowledge of risk factors and measures for prevention of condition will improve Outcome: Progressing   Problem: Coping: Goal: Psychosocial and spiritual needs will be supported Outcome: Progressing   Problem: Respiratory: Goal: Will maintain a patent airway Outcome: Progressing Goal: Complications related to the disease process, condition or treatment will be avoided or minimized Outcome: Progressing

## 2019-10-14 NOTE — Progress Notes (Signed)
[facility] PROGRESS NOTE    Progress Note  Hunter Peters  [id number] DOB: Jun 12, 1951 DOA: 10/13/2019 PCP: Guadalupe Maple, MD     Brief Narrative:   Hunter Peters is an 68 y.o. male past medical history of CAD, diabetes mellitus type 2 tested positive for COVID-19 10/06/2019 started having shortness of breath about a week prior to admission pulse ox at home he was 70% on room air he decided to called EMS was placed on 6 L and saturations improved to 94%.  Assessment/Plan:   Acute respiratory failure with hypoxia due to Pneumonia due to COVID-19 virus : Current 5 L of oxygen keep saturations greater than 88%.  His inflammatory markers are significantly elevated really his D-dimer, will increase his Lovenox to subtherapeutic Start on IV remdesivir and steroids on admission, vitamin C and zinc. Try to keep the patient peripherally 16 hours a day. The patient and I had a long talk about IV Actemra yesterday due to his oxygen requirement needs  stabilized, is procalcitonin was less than 0.1 on admission.  He is currently quiring 5 L and relates his dyspnea is significantly improved compared to yesterday. The treatment plan and use of medications and known side effects were discussed with patient/family, they were clearly explained that there is no proven definitive treatment for COVID-19 infection, any medications used here are based on published clinical articles/anecdotal data which are not peer-reviewed or randomized control trials.  Complete risks and long-term side effects are unknown, however in the best clinical judgment they seem to be of some clinical benefit rather than medical risks.  Patient/family agree with the treatment plan and want to receive the given medications.  Diabetes mellitus type 2, controlled, without complications (HCC) 123456 was 8.2. His blood glucose was 290 on admission his metformin was held he was started on long-acting insulin plus sliding scale and blood  sugar is improved. Continue AC at bedtime CBGs.  Hypercholesterolemia Continue statins.   DVT prophylaxis: lovenox Family Communication:None Disposition Plan/Barrier to D/C: once off oxygen and completed his remdesivir Code Status:     Code Status Orders  (From admission, onward)         Start     Ordered   10/13/19 1819  Full code  Continuous     10/13/19 1824        Code Status History    Date Active Date Inactive Code Status Order ID Comments User Context   10/13/2019 0149 10/13/2019 1756 Full Code [id number]  Orene Desanctis, DO ED   Advance Care Planning Activity    Advance Directive Documentation     Most Recent Value  Type of Advance Directive  Living will, Healthcare Power of Attorney  Pre-existing out of facility DNR order (yellow form or pink MOST form)  -  "MOST" Form in Place?  -        IV Access:    Peripheral IV   Procedures and diagnostic studies:   Dg Chest Port 1 View  Result Date: 10/13/2019 CLINICAL DATA:  COVID-19, worsening dyspnea EXAM: PORTABLE CHEST 1 VIEW COMPARISON:  Coronary CT 09/04/2019, chest radiograph 05/17/2010 FINDINGS: Multifocal areas of interstitial and airspace opacity throughout both lungs with diminished volumes and streaky basilar areas of subsegmental atelectasis. No pneumothorax or effusion. Cardiomediastinal prominence likely accentuated by portable technique. No acute osseous or soft tissue abnormality. Degenerative changes are present in the imaged spine and shoulders. IMPRESSION: Multifocal areas of interstitial and airspace opacity throughout both lungs, compatible with  multifocal pneumonia. Electronically Signed   By: Lovena Le M.D.   On: 10/13/2019 00:18     Medical Consultants:    None.  Anti-Infectives:   IV remdesivir  Subjective:    Hunter Peters he relates his breathing is improved compared to yesterday, he feels like he has more energy today and his appetite has returned.  Objective:    Vitals:    10/13/19 2000 10/13/19 2257 10/14/19 0000 10/14/19 0500  BP: 105/61  132/72 113/75  Pulse: 92  91 72  Resp: 20  (!) 23 (!) 23  Temp: 98.1 F (36.7 C)  98.3 F (36.8 C) 98.5 F (36.9 C)  TempSrc: Oral  Oral Oral  SpO2: 94%  90% 93%  Weight:  95.3 kg    Height:  5\' 10"  (1.778 m)     SpO2: 93 % O2 Flow Rate (L/min): 6 L/min   Intake/Output Summary (Last 24 hours) at 10/14/2019 0734 Last data filed at 10/14/2019 0643 Gross per 24 hour  Intake 820 ml  Output 850 ml  Net -30 ml   Filed Weights   10/13/19 2257  Weight: 95.3 kg    Exam: General exam: In no acute distress. Respiratory system: Good air movement and diffuse crackles bilaterally. Cardiovascular system: S1 & S2 heard, RRR. No JVD. Gastrointestinal system: Abdomen is nondistended, soft and nontender.  Central nervous system: Alert and oriented. No focal neurological deficits. Extremities: No pedal edema. Skin: No rashes, lesions or ulcers Psychiatry: Judgement and insight appear normal. Mood & affect appropriate.   Data Reviewed:    Labs: Basic Metabolic Panel: Recent Labs  Lab 10/12/19 2346 10/13/19 0901 10/14/19 0220  NA 135 137 138  K 3.9 4.1 4.5  CL 100 103 102  CO2 22 22 25   GLUCOSE 290* 274* 155*  BUN 21 21 25*  CREATININE 0.78 0.74 0.91  CALCIUM 8.0* 7.6* 7.8*  MG  --  2.9*  --    GFR Estimated Creatinine Clearance: 92.5 mL/min (by C-G formula based on SCr of 0.91 mg/dL). Liver Function Tests: Recent Labs  Lab 10/12/19 2346 10/13/19 0901 10/14/19 0220  AST 44* 35 29  ALT 44 40 31  ALKPHOS 55 52 48  BILITOT 0.8 0.9 0.7  PROT 7.0 7.0 6.4*  ALBUMIN 2.7* 2.7* 2.6*   No results for input(s): LIPASE, AMYLASE in the last 168 hours. No results for input(s): AMMONIA in the last 168 hours. Coagulation profile No results for input(s): INR, PROTIME in the last 168 hours. COVID-19 Labs  Recent Labs    10/12/19 2346 10/13/19 0901 10/14/19 0220  DDIMER  --   --  7.03*  FERRITIN  1,295* 1,321*  --   LDH 466*  --   --   CRP 13.7* 12.1* 6.9*    Lab Results  Component Value Date   SARSCOV2NAA Detected (A) 10/06/2019    CBC: Recent Labs  Lab 10/12/19 2346 10/13/19 0901 10/14/19 0220  WBC 7.4 8.6 11.4*  NEUTROABS 6.6 7.8* 10.6*  HGB 13.8 13.8 13.4  HCT 40.4 39.1 40.4  MCV 86.5 84.1 90.2  PLT 512* 500* 513*   Cardiac Enzymes: No results for input(s): CKTOTAL, CKMB, CKMBINDEX, TROPONINI in the last 168 hours. BNP (last 3 results) No results for input(s): PROBNP in the last 8760 hours. CBG: Recent Labs  Lab 10/13/19 0719 10/13/19 0859 10/13/19 1432 10/13/19 1846 10/13/19 2053  GLUCAP 269* 252* 213* 231* 247*   D-Dimer: Recent Labs    10/14/19 0220  DDIMER 7.03*  Hgb A1c: Recent Labs    10/13/19 0217  HGBA1C 8.2*   Lipid Profile: Recent Labs    10/12/19 2346  TRIG 143   Thyroid function studies: No results for input(s): TSH, T4TOTAL, T3FREE, THYROIDAB in the last 72 hours.  Invalid input(s): FREET3 Anemia work up: Recent Labs    10/12/19 2346 10/13/19 0901  FERRITIN 1,295* 1,321*   Sepsis Labs: Recent Labs  Lab 10/12/19 2346 10/13/19 0215 10/13/19 0901 10/14/19 0220  PROCALCITON <0.10  --   --   --   WBC 7.4  --  8.6 11.4*  LATICACIDVEN 2.7* 2.1*  --   --    Microbiology Recent Results (from the past 240 hour(s))  Novel Coronavirus, NAA ([facility])     Status: Abnormal   Collection Time: 10/06/19 12:43 PM   Specimen: Nasopharyngeal(NP) swabs in vial transport medium   NASOPHARYNGE  TESTING  Result Value Ref Range Status   SARS-CoV-2, NAA Detected (A) Not Detected Final    Comment: This nucleic acid amplification test was developed and its performance characteristics determined by [employer]. Nucleic acid amplification tests include PCR and TMA. This test has not been FDA cleared or approved. This test has been authorized by FDA under an Emergency Use Authorization (EUA). This test is only authorized for  the duration of time the declaration that circumstances exist justifying the authorization of the emergency use of in vitro diagnostic tests for detection of SARS-CoV-2 virus and/or diagnosis of COVID-19 infection under section 564(b)(1) of the Act, 21 U.S.C. [id number]) (1), unless the authorization is terminated or revoked sooner. When diagnostic testing is negative, the possibility of a false negative result should be considered in the context of a patient's recent exposures and the presence of clinical signs and symptoms consistent with COVID-19. An individual without symptoms of COVID-19 and who is not shedding SARS-CoV-2 virus would  expect to have a negative (not detected) result in this assay.   MRSA PCR Screening     Status: None   Collection Time: 10/13/19  6:49 PM   Specimen: Nasal Mucosa; Nasopharyngeal  Result Value Ref Range Status   MRSA by PCR NEGATIVE NEGATIVE Final    Comment:        The GeneXpert MRSA Assay (FDA approved for NASAL specimens only), is one component of a comprehensive MRSA colonization surveillance program. It is not intended to diagnose MRSA infection nor to guide or monitor treatment for MRSA infections. Performed at [facility], [place] [address] [place], [place] [address]      Medications:   . dexamethasone (DECADRON) injection  6 mg Intravenous Q12H  . enoxaparin (LOVENOX) injection  40 mg Subcutaneous Q24H  . insulin aspart  0-20 Units Subcutaneous TID WC  . insulin aspart  0-5 Units Subcutaneous QHS  . insulin aspart  6 Units Subcutaneous TID WC  . insulin detemir  25 Units Subcutaneous BID  . losartan  25 mg Oral Daily  . rosuvastatin  10 mg Oral Daily  . sodium chloride flush  3 mL Intravenous Q12H  . vitamin C  500 mg Oral Daily  . zinc sulfate  220 mg Oral Daily   Continuous Infusions: . sodium chloride    . remdesivir 100 mg in NS 250 mL Stopped (10/13/19 2046)      LOS: 1 day   Charlynne Cousins  [facility]  10/14/2019, 7:34 AM

## 2019-10-15 DIAGNOSIS — E7800 Pure hypercholesterolemia, unspecified: Secondary | ICD-10-CM

## 2019-10-15 LAB — CBC WITH DIFFERENTIAL/PLATELET
Abs Immature Granulocytes: 0.1 10*3/uL — ABNORMAL HIGH (ref 0.00–0.07)
Basophils Absolute: 0 10*3/uL (ref 0.0–0.1)
Basophils Relative: 0 %
Eosinophils Absolute: 0 10*3/uL (ref 0.0–0.5)
Eosinophils Relative: 0 %
HCT: 40.6 % (ref 39.0–52.0)
Hemoglobin: 13.4 g/dL (ref 13.0–17.0)
Immature Granulocytes: 1 %
Lymphocytes Relative: 8 %
Lymphs Abs: 0.6 10*3/uL — ABNORMAL LOW (ref 0.7–4.0)
MCH: 29.6 pg (ref 26.0–34.0)
MCHC: 33 g/dL (ref 30.0–36.0)
MCV: 89.6 fL (ref 80.0–100.0)
Monocytes Absolute: 0.1 10*3/uL (ref 0.1–1.0)
Monocytes Relative: 2 %
Neutro Abs: 7.6 10*3/uL (ref 1.7–7.7)
Neutrophils Relative %: 89 %
Platelets: 456 10*3/uL — ABNORMAL HIGH (ref 150–400)
RBC: 4.53 MIL/uL (ref 4.22–5.81)
RDW: 14 % (ref 11.5–15.5)
WBC: 8.5 10*3/uL (ref 4.0–10.5)
nRBC: 0 % (ref 0.0–0.2)

## 2019-10-15 LAB — C-REACTIVE PROTEIN: CRP: 3.8 mg/dL — ABNORMAL HIGH (ref <1.0)

## 2019-10-15 LAB — COMPREHENSIVE METABOLIC PANEL
ALT: 32 U/L (ref 0–44)
AST: 34 U/L (ref 15–41)
Albumin: 2.5 g/dL — ABNORMAL LOW (ref 3.5–5.0)
Alkaline Phosphatase: 47 U/L (ref 38–126)
Anion gap: 11 (ref 5–15)
BUN: 22 mg/dL (ref 8–23)
CO2: 22 mmol/L (ref 22–32)
Calcium: 7.9 mg/dL — ABNORMAL LOW (ref 8.9–10.3)
Chloride: 107 mmol/L (ref 98–111)
Creatinine, Ser: 0.72 mg/dL (ref 0.61–1.24)
GFR calc Af Amer: >60 mL/min (ref >60)
GFR calc non Af Amer: >60 mL/min (ref >60)
Glucose, Bld: 77 mg/dL (ref 70–99)
Potassium: 4.2 mmol/L (ref 3.5–5.1)
Sodium: 140 mmol/L (ref 135–145)
Total Bilirubin: 0.4 mg/dL (ref 0.3–1.2)
Total Protein: 6.3 g/dL — ABNORMAL LOW (ref 6.5–8.1)

## 2019-10-15 LAB — GLUCOSE, CAPILLARY
Glucose-Capillary: 89 mg/dL (ref 70–99)
Glucose-Capillary: 181 mg/dL — ABNORMAL HIGH (ref 70–99)
Glucose-Capillary: 243 mg/dL — ABNORMAL HIGH (ref 70–99)
Glucose-Capillary: 119 mg/dL — ABNORMAL HIGH (ref 70–99)

## 2019-10-15 LAB — D-DIMER, QUANTITATIVE: D-Dimer, Quant: 13.6 ug/mL-FEU — ABNORMAL HIGH (ref 0.00–0.50)

## 2019-10-15 MED ORDER — FUROSEMIDE 10 MG/ML IJ SOLN
40.0000 mg | Freq: Once | INTRAMUSCULAR | Status: AC
  Administered 2019-10-15: 08:00:00 40 mg via INTRAVENOUS
  Filled 2019-10-15: qty 4

## 2019-10-15 NOTE — Progress Notes (Signed)
PROGRESS NOTE  Hunter Peters [id number] DOB: 10-Apr-1951 DOA: 10/13/2019 PCP: Guadalupe Maple, MD   LOS: 2 days   Brief Narrative / Interim history: 68 year old male with CAD, type 2 diabetes mellitus, Covid +11/16 came to the hospital with worsening shortness of breath, found his pulse ox was 70% at home on room air and came to the hospital.  Initially required 6 L nasal cannula.  Subjective / 24h Interval events: He is feeling better this morning, found patient on room air.  States his breathing difficulties have improved significantly.  Assessment & Plan: Active Problems:   Diabetes mellitus type 2, controlled, without complications ([place])   Hypercholesterolemia   Pneumonia due to COVID-19 virus   Acute respiratory failure with hypoxia (HCC)   Principal Problem Acute Hypoxic Respiratory Failure due to Covid-19 Viral Illness -On my evaluation this morning patient on room air -Continue remdesivir and steroids, he is to finish on 11/26 -Clinically appears to be improving -He is on Lovenox twice daily scheduling due to an elevated D-dimer  COVID-19 Labs  Recent Labs    10/12/19 2346 10/13/19 0901 10/14/19 0220 10/15/19 0355  DDIMER  --   --  7.03* 13.60*  FERRITIN 1,295* 1,321*  --   --   LDH 466*  --   --   --   CRP 13.7* 12.1* 6.9* 3.8*    Lab Results  Component Value Date   SARSCOV2NAA Detected (A) 10/06/2019   Active Problems Type 2 diabetes mellitus -A1c was 8.2, continue Levemir 25 twice daily along with 6 units scheduled plus sliding scale  Hypertension -Continue losartan  Hyperlipidemia -Continue statin  Scheduled Meds: . dexamethasone (DECADRON) injection  6 mg Intravenous Q12H  . enoxaparin (LOVENOX) injection  40 mg Subcutaneous Q12H  . insulin aspart  0-20 Units Subcutaneous TID WC  . insulin aspart  0-5 Units Subcutaneous QHS  . insulin aspart  6 Units Subcutaneous TID WC  . insulin detemir  25 Units Subcutaneous BID  . losartan  25 mg Oral  Daily  . rosuvastatin  10 mg Oral Daily  . sodium chloride flush  3 mL Intravenous Q12H  . vitamin C  500 mg Oral Daily  . zinc sulfate  220 mg Oral Daily   Continuous Infusions: . sodium chloride    . remdesivir 100 mg in NS 250 mL 100 mg (10/15/19 1141)   PRN Meds:.sodium chloride, guaiFENesin-dextromethorphan, ondansetron **OR** ondansetron (ZOFRAN) IV, polyethylene glycol, sodium chloride flush  DVT prophylaxis: Lovenox Code Status: Full code Family Communication: Discussed with patient Disposition Plan: Home when ready  Consultants:  None  Procedures:  None   Microbiology: None   Antimicrobials: None   Objective: Vitals:   10/15/19 0000 10/15/19 0412 10/15/19 0414 10/15/19 0800  BP: 116/84  111/64 101/68  Pulse: 63  64 71  Resp: (!) 27  (!) 24 20  Temp: 98.1 F (36.7 C) 98.1 F (36.7 C)  97.7 F (36.5 C)  TempSrc: Oral Oral  Oral  SpO2: 95%  92% 91%  Weight:      Height:        Intake/Output Summary (Last 24 hours) at 10/15/2019 1320 Last data filed at 10/15/2019 0900 Gross per 24 hour  Intake 240 ml  Output 1150 ml  Net -910 ml   Filed Weights   10/13/19 2257  Weight: 95.3 kg    Examination:  Constitutional: NAD Eyes: PERRL, lids and conjunctivae normal ENMT: Mucous membranes are moist.  Respiratory: clear to auscultation bilaterally, no  wheezing, no crackles. Normal respiratory effort. N Cardiovascular: Regular rate and rhythm, no murmurs / rubs / gallops. No LE edema. Abdomen: no tenderness. Bowel sounds positive.  Musculoskeletal: no clubbing / cyanosis. Skin: no rashes Neurologic: CN 2-12 grossly intact. Strength 5/5 in all 4.  Psychiatric: Normal judgment and insight. Alert and oriented x 3. Normal mood.    Data Reviewed: I have independently reviewed following labs and imaging studies   CBC: Recent Labs  Lab 10/12/19 2346 10/13/19 0901 10/14/19 0220 10/15/19 0355  WBC 7.4 8.6 11.4* 8.5  NEUTROABS 6.6 7.8* 10.6* 7.6  HGB  13.8 13.8 13.4 13.4  HCT 40.4 39.1 40.4 40.6  MCV 86.5 84.1 90.2 89.6  PLT 512* 500* 513* [id number]*   Basic Metabolic Panel: Recent Labs  Lab 10/12/19 2346 10/13/19 0901 10/14/19 0220 10/15/19 0355  NA 135 137 138 140  K 3.9 4.1 4.5 4.2  CL 100 103 102 107  CO2 22 22 25 22   GLUCOSE 290* 274* 155* 77  BUN 21 21 25* 22  CREATININE 0.78 0.74 0.91 0.72  CALCIUM 8.0* 7.6* 7.8* 7.9*  MG  --  2.9*  --   --    GFR: Estimated Creatinine Clearance: 105.2 mL/min (by C-G formula based on SCr of 0.72 mg/dL). Liver Function Tests: Recent Labs  Lab 10/12/19 2346 10/13/19 0901 10/14/19 0220 10/15/19 0355  AST 44* 35 29 34  ALT 44 40 31 32  ALKPHOS 55 52 48 47  BILITOT 0.8 0.9 0.7 0.4  PROT 7.0 7.0 6.4* 6.3*  ALBUMIN 2.7* 2.7* 2.6* 2.5*   No results for input(s): LIPASE, AMYLASE in the last 168 hours. No results for input(s): AMMONIA in the last 168 hours. Coagulation Profile: No results for input(s): INR, PROTIME in the last 168 hours. Cardiac Enzymes: No results for input(s): CKTOTAL, CKMB, CKMBINDEX, TROPONINI in the last 168 hours. BNP (last 3 results) No results for input(s): PROBNP in the last 8760 hours. HbA1C: Recent Labs    10/13/19 0217 10/14/19 0220  HGBA1C 8.2* 8.2*   CBG: Recent Labs  Lab 10/14/19 0729 10/14/19 1207 10/14/19 1618 10/14/19 2051 10/15/19 0736  GLUCAP 171* 167* 104* 228* 89   Lipid Profile: Recent Labs    10/12/19 2346  TRIG 143   Thyroid Function Tests: No results for input(s): TSH, T4TOTAL, FREET4, T3FREE, THYROIDAB in the last 72 hours. Anemia Panel: Recent Labs    10/12/19 2346 10/13/19 0901  FERRITIN 1,295* 1,321*   Urine analysis:    Component Value Date/Time   APPEARANCEUR Clear 08/07/2019 1029   GLUCOSEU Negative 08/07/2019 1029   BILIRUBINUR Negative 08/07/2019 1029   PROTEINUR Negative 08/07/2019 1029   NITRITE Negative 08/07/2019 1029   LEUKOCYTESUR Negative 08/07/2019 1029   Sepsis Labs: Invalid input(s):  PROCALCITONIN, LACTICIDVEN  Recent Results (from the past 240 hour(s))  Novel Coronavirus, NAA ([facility])     Status: Abnormal   Collection Time: 10/06/19 12:43 PM   Specimen: Nasopharyngeal(NP) swabs in vial transport medium   NASOPHARYNGE  TESTING  Result Value Ref Range Status   SARS-CoV-2, NAA Detected (A) Not Detected Final    Comment: This nucleic acid amplification test was developed and its performance characteristics determined by [employer]. Nucleic acid amplification tests include PCR and TMA. This test has not been FDA cleared or approved. This test has been authorized by FDA under an Emergency Use Authorization (EUA). This test is only authorized for the duration of time the declaration that circumstances exist justifying the authorization of the  emergency use of in vitro diagnostic tests for detection of SARS-CoV-2 virus and/or diagnosis of COVID-19 infection under section 564(b)(1) of the Act, 21 U.S.C. PT:2852782) (1), unless the authorization is terminated or revoked sooner. When diagnostic testing is negative, the possibility of a false negative result should be considered in the context of a patient's recent exposures and the presence of clinical signs and symptoms consistent with COVID-19. An individual without symptoms of COVID-19 and who is not shedding SARS-CoV-2 virus would  expect to have a negative (not detected) result in this assay.   MRSA PCR Screening     Status: None   Collection Time: 10/13/19  6:49 PM   Specimen: Nasal Mucosa; Nasopharyngeal  Result Value Ref Range Status   MRSA by PCR NEGATIVE NEGATIVE Final    Comment:        The GeneXpert MRSA Assay (FDA approved for NASAL specimens only), is one component of a comprehensive MRSA colonization surveillance program. It is not intended to diagnose MRSA infection nor to guide or monitor treatment for MRSA infections. Performed at [facility], [place] [address],  [place], [place] [address]       Radiology Studies: No results found.  Marzetta Board, MD, PhD [facility]  Contact via  [email or web address]  [place] P: [phone] F: [phone]

## 2019-10-16 ENCOUNTER — Inpatient Hospital Stay (HOSPITAL_COMMUNITY): Payer: Medicare Other

## 2019-10-16 DIAGNOSIS — I2609 Other pulmonary embolism with acute cor pulmonale: Secondary | ICD-10-CM

## 2019-10-16 LAB — CBC WITH DIFFERENTIAL/PLATELET
Abs Immature Granulocytes: 0.14 10*3/uL — ABNORMAL HIGH (ref 0.00–0.07)
Basophils Absolute: 0 10*3/uL (ref 0.0–0.1)
Basophils Relative: 0 %
Eosinophils Absolute: 0 10*3/uL (ref 0.0–0.5)
Eosinophils Relative: 0 %
HCT: 41.2 % (ref 39.0–52.0)
Hemoglobin: 13.9 g/dL (ref 13.0–17.0)
Immature Granulocytes: 1 %
Lymphocytes Relative: 6 %
Lymphs Abs: 0.6 10*3/uL — ABNORMAL LOW (ref 0.7–4.0)
MCH: 30.3 pg (ref 26.0–34.0)
MCHC: 33.7 g/dL (ref 30.0–36.0)
MCV: 89.8 fL (ref 80.0–100.0)
Monocytes Absolute: 0.2 10*3/uL (ref 0.1–1.0)
Monocytes Relative: 2 %
Neutro Abs: 9.1 10*3/uL — ABNORMAL HIGH (ref 1.7–7.7)
Neutrophils Relative %: 91 %
Platelets: 420 10*3/uL — ABNORMAL HIGH (ref 150–400)
RBC: 4.59 MIL/uL (ref 4.22–5.81)
RDW: 13.9 % (ref 11.5–15.5)
WBC: 10.1 10*3/uL (ref 4.0–10.5)
nRBC: 0 % (ref 0.0–0.2)

## 2019-10-16 LAB — COMPREHENSIVE METABOLIC PANEL
ALT: 43 U/L (ref 0–44)
AST: 40 U/L (ref 15–41)
Albumin: 2.6 g/dL — ABNORMAL LOW (ref 3.5–5.0)
Alkaline Phosphatase: 46 U/L (ref 38–126)
Anion gap: 10 (ref 5–15)
BUN: 28 mg/dL — ABNORMAL HIGH (ref 8–23)
CO2: 23 mmol/L (ref 22–32)
Calcium: 7.7 mg/dL — ABNORMAL LOW (ref 8.9–10.3)
Chloride: 104 mmol/L (ref 98–111)
Creatinine, Ser: 0.88 mg/dL (ref 0.61–1.24)
GFR calc Af Amer: >60 mL/min (ref >60)
GFR calc non Af Amer: >60 mL/min (ref >60)
Glucose, Bld: 79 mg/dL (ref 70–99)
Potassium: 4.3 mmol/L (ref 3.5–5.1)
Sodium: 137 mmol/L (ref 135–145)
Total Bilirubin: 1 mg/dL (ref 0.3–1.2)
Total Protein: 6.1 g/dL — ABNORMAL LOW (ref 6.5–8.1)

## 2019-10-16 LAB — GLUCOSE, CAPILLARY
Glucose-Capillary: 166 mg/dL — ABNORMAL HIGH (ref 70–99)
Glucose-Capillary: 132 mg/dL — ABNORMAL HIGH (ref 70–99)
Glucose-Capillary: 213 mg/dL — ABNORMAL HIGH (ref 70–99)
Glucose-Capillary: 94 mg/dL (ref 70–99)

## 2019-10-16 LAB — D-DIMER, QUANTITATIVE: D-Dimer, Quant: 13.55 ug/mL-FEU — ABNORMAL HIGH (ref 0.00–0.50)

## 2019-10-16 LAB — HEPARIN LEVEL (UNFRACTIONATED): Heparin Unfractionated: 0.56 IU/mL (ref 0.30–0.70)

## 2019-10-16 LAB — C-REACTIVE PROTEIN: CRP: 2.1 mg/dL — ABNORMAL HIGH (ref <1.0)

## 2019-10-16 MED ORDER — IOHEXOL 350 MG/ML SOLN
75.0000 mL | Freq: Once | INTRAVENOUS | Status: AC | PRN
  Administered 2019-10-16: 75 mL via INTRAVENOUS

## 2019-10-16 MED ORDER — HEPARIN BOLUS VIA INFUSION
4000.0000 [IU] | Freq: Once | INTRAVENOUS | Status: AC
  Administered 2019-10-16: 12:00:00 4000 [IU] via INTRAVENOUS
  Filled 2019-10-16: qty 4000

## 2019-10-16 MED ORDER — HEPARIN (PORCINE) 25000 UT/250ML-% IV SOLN
1400.0000 [IU]/h | INTRAVENOUS | Status: AC
  Administered 2019-10-16 – 2019-10-17 (×2): 1400 [IU]/h via INTRAVENOUS
  Filled 2019-10-16 (×2): qty 250

## 2019-10-16 MED ORDER — INSULIN DETEMIR 100 UNIT/ML ~~LOC~~ SOLN
12.0000 [IU] | Freq: Once | SUBCUTANEOUS | Status: AC
  Administered 2019-10-16: 12 [IU] via SUBCUTANEOUS
  Filled 2019-10-16: qty 0.12

## 2019-10-16 NOTE — Progress Notes (Signed)
ANTICOAGULATION CONSULT NOTE - Initial Consult  Pharmacy Consult for heparin Indication: pulmonary embolus  Allergies  Allergen Reactions  . Sulfonamide Derivatives     REACTION: GI Intolerance    Patient Measurements: Height: 5\' 10"  (177.8 cm) Weight: 210 lb 1.6 oz (95.3 kg) IBW/kg (Calculated) : 73 Heparin Dosing Weight: 95 kg   Vital Signs: Temp: 97.7 F (36.5 C) (11/26 0402) Temp Source: Oral (11/26 0402) BP: 97/84 (11/26 0800) Pulse Rate: 68 (11/26 0800)  Labs: Recent Labs    10/14/19 0220 10/15/19 0355 10/16/19 0330  HGB 13.4 13.4 13.9  HCT 40.4 40.6 41.2  PLT 513* 456* 420*  CREATININE 0.91 0.72 0.88    Estimated Creatinine Clearance: 95.7 mL/min (by C-G formula based on SCr of 0.88 mg/dL).   Medical History: Past Medical History:  Diagnosis Date  . Diabetes mellitus without complication (HCC)     Medications:  Medications Prior to Admission  Medication Sig Dispense Refill Last Dose  . losartan (COZAAR) 25 MG tablet Take 1 tablet (25 mg total) by mouth daily. 30 tablet 3 Past Week at Unknown time  . metFORMIN (GLUCOPHAGE) 500 MG tablet Take 2 tablets (1,000 mg total) by mouth 2 (two) times daily with a meal. 120 tablet 3 Past Week at Unknown time  . predniSONE (DELTASONE) 10 MG tablet 6 tabs today and tomorrow, 5 tabs the next 2 days, decrease by 1 every other until gone. 42 tablet 0 10/12/2019 at Unknown time  . rosuvastatin (CRESTOR) 10 MG tablet Take 1 tablet (10 mg total) by mouth daily. 30 tablet 3 Past Week at Unknown time  . valACYclovir (VALTREX) 1000 MG tablet Take 1 tablet (1,000 mg total) by mouth 2 (two) times daily. 20 tablet 0 10/12/2019 at Unknown time  . betamethasone dipropionate (DIPROLENE) 0.05 % cream Apply topically 2 (two) times daily. (Patient taking differently: Apply topically 2 (two) times daily as needed. ) 30 g 0 prn at prn    Assessment: 36 YOF with COVID-19 pneumonia with acute bilateral pulmonary embolism with evidence of  R heart strain. Pharmacy consulted to start IV heparin. Currently on prophylactic Lovenox. H/H wnl, Plt 420k. SCr wnl.   Goal of Therapy:  Heparin level 0.3-0.7 units/ml Monitor platelets by anticoagulation protocol: Yes   Plan:  -D/c Lovenox  -Heparin 4000 units IV bolus and then start IV heparin at 1400 units/hr  -F/u 6 hr HL -Monitor daily HL, CBC and s/s of bleeding   Albertina Parr, PharmD., BCPS Clinical Pharmacist Clinical phone for 10/16/19 until 5pm: [phone]

## 2019-10-16 NOTE — Progress Notes (Signed)
ANTICOAGULATION CONSULT NOTE - Initial Consult  Pharmacy Consult for heparin Indication: pulmonary embolus  Allergies  Allergen Reactions  . Sulfonamide Derivatives     REACTION: GI Intolerance    Patient Measurements: Height: 5\' 10"  (177.8 cm) Weight: 210 lb 1.6 oz (95.3 kg) IBW/kg (Calculated) : 73 Heparin Dosing Weight: 95 kg   Vital Signs: Temp: 98.2 F (36.8 C) (11/26 1700) Temp Source: Oral (11/26 1700) BP: 124/77 (11/26 1700) Pulse Rate: 63 (11/26 1700)  Labs: Recent Labs    10/14/19 0220 10/15/19 0355 10/16/19 0330 10/16/19 1715  HGB 13.4 13.4 13.9  --   HCT 40.4 40.6 41.2  --   PLT 513* 456* 420*  --   HEPARINUNFRC  --   --   --  0.56  CREATININE 0.91 0.72 0.88  --     Estimated Creatinine Clearance: 95.7 mL/min (by C-G formula based on SCr of 0.88 mg/dL).   Medical History: Past Medical History:  Diagnosis Date  . Diabetes mellitus without complication (HCC)     Medications:  Medications Prior to Admission  Medication Sig Dispense Refill Last Dose  . losartan (COZAAR) 25 MG tablet Take 1 tablet (25 mg total) by mouth daily. 30 tablet 3 Past Week at Unknown time  . metFORMIN (GLUCOPHAGE) 500 MG tablet Take 2 tablets (1,000 mg total) by mouth 2 (two) times daily with a meal. 120 tablet 3 Past Week at Unknown time  . predniSONE (DELTASONE) 10 MG tablet 6 tabs today and tomorrow, 5 tabs the next 2 days, decrease by 1 every other until gone. 42 tablet 0 10/12/2019 at Unknown time  . rosuvastatin (CRESTOR) 10 MG tablet Take 1 tablet (10 mg total) by mouth daily. 30 tablet 3 Past Week at Unknown time  . valACYclovir (VALTREX) 1000 MG tablet Take 1 tablet (1,000 mg total) by mouth 2 (two) times daily. 20 tablet 0 10/12/2019 at Unknown time  . betamethasone dipropionate (DIPROLENE) 0.05 % cream Apply topically 2 (two) times daily. (Patient taking differently: Apply topically 2 (two) times daily as needed. ) 30 g 0 prn at prn    Assessment: 89 YOF with  COVID-19 pneumonia with acute bilateral pulmonary embolism with evidence of R heart strain. Pharmacy consulted to start IV heparin. Currently on prophylactic Lovenox.  -HL 0.56 at goal -No bleeding per RN  Goal of Therapy:  Heparin level 0.3-0.7 units/ml Monitor platelets by anticoagulation protocol: Yes   Plan:  -Continue heparin infusion at 1400 units/hr -Monitor daily HL, CBC and s/s of bleeding   [name], PharmD, BCPS Clinical Pharmacist

## 2019-10-16 NOTE — Progress Notes (Signed)
PROGRESS NOTE  Hunter Peters [id number] DOB: 01-Nov-1951 DOA: 10/13/2019 PCP: Guadalupe Maple, MD   LOS: 3 days   Brief Narrative / Interim history: 68 year old male with CAD, type 2 diabetes mellitus, Covid +11/16 came to the hospital with worsening shortness of breath, found his pulse ox was 70% at home on room air and came to the hospital.  Initially required 6 L nasal cannula.  Subjective / 24h Interval events: Overall improved, denies any significant shortness of breath but still dyspneic with activities  Assessment & Plan: Active Problems:   Diabetes mellitus type 2, controlled, without complications ([place])   Hypercholesterolemia   Pneumonia due to COVID-19 virus   Acute respiratory failure with hypoxia (HCC)   Principal Problem Acute Hypoxic Respiratory Failure due to Covid-19 Viral Illness and acute pulmonary embolism -On my evaluation this morning patient on room air, however still slightly dyspneic -Continue remdesivir and steroids, he is to finish on 11/26 -Clinically appears to be improving -D-dimer persistently elevated and jumped significantly yesterday, remains elevated today, obtain a CT angiogram given slight dyspnea which is positive for bilateral PEs.  Discussed with patient, no prior history of bleed, start IV heparin and closely monitor for bleeding  COVID-19 Labs  Recent Labs    10/14/19 0220 10/15/19 0355 10/16/19 0330  DDIMER 7.03* 13.60* 13.55*  CRP 6.9* 3.8* 2.1*    Lab Results  Component Value Date   SARSCOV2NAA Detected (A) 10/06/2019   Active Problems Acute pulmonary embolism -See discussion above, CT angiogram today in the setting of significantly elevated D-dimer positive for PE  Type 2 diabetes mellitus -A1c was 8.2, continue long-acting plus short-acting.  CBGs fairly well controlled  CBG (last 3)  Recent Labs    10/15/19 1549 10/15/19 2054 10/16/19 0917  GLUCAP 243* 119* 166*     Hypertension -Continue  losartan  Hyperlipidemia -Continue statin  Scheduled Meds:  dexamethasone (DECADRON) injection  6 mg Intravenous Q12H   heparin  4,000 Units Intravenous Once   insulin aspart  0-20 Units Subcutaneous TID WC   insulin aspart  0-5 Units Subcutaneous QHS   insulin aspart  6 Units Subcutaneous TID WC   insulin detemir  25 Units Subcutaneous BID   losartan  25 mg Oral Daily   rosuvastatin  10 mg Oral Daily   sodium chloride flush  3 mL Intravenous Q12H   vitamin C  500 mg Oral Daily   zinc sulfate  220 mg Oral Daily   Continuous Infusions:  sodium chloride     heparin     PRN Meds:.sodium chloride, guaiFENesin-dextromethorphan, ondansetron **OR** ondansetron (ZOFRAN) IV, polyethylene glycol, sodium chloride flush  DVT prophylaxis: Lovenox Code Status: Full code Family Communication: Discussed with patient Disposition Plan: Home when ready  Consultants:  None  Procedures:  None   Microbiology: None   Antimicrobials: None   Objective: Vitals:   10/16/19 0100 10/16/19 0402 10/16/19 0500 10/16/19 0800  BP:  109/67  97/84  Pulse: 75 66 64 68  Resp: (!) 22 (!) 22 (!) 21 20  Temp:  97.7 F (36.5 C)  97.9 F (36.6 C)  TempSrc:  Oral  Oral  SpO2: 93% 94% 94% 92%  Weight:      Height:        Intake/Output Summary (Last 24 hours) at 10/16/2019 1126 Last data filed at 10/16/2019 0636 Gross per 24 hour  Intake 850 ml  Output 2650 ml  Net -1800 ml   Filed Weights   10/13/19 2257  Weight: 95.3 kg    Examination:  Constitutional: No distress Eyes: No scleral icterus ENMT: Mucous membranes are moist.  Respiratory: Diminished at the bases but overall clear without wheezing or crackles Cardiovascular: Regular rate and rhythm, no murmurs, no peripheral edema Abdomen: Soft, NT, ND, positive bowel sounds Musculoskeletal: no clubbing / cyanosis. Skin: No rashes seen Neurologic: Nonfocal, equal strength Psychiatric: Normal judgment and insight. Alert and  oriented x 3. Normal mood.    Data Reviewed: I have independently reviewed following labs and imaging studies   CBC: Recent Labs  Lab 10/12/19 2346 10/13/19 0901 10/14/19 0220 10/15/19 0355 10/16/19 0330  WBC 7.4 8.6 11.4* 8.5 10.1  NEUTROABS 6.6 7.8* 10.6* 7.6 9.1*  HGB 13.8 13.8 13.4 13.4 13.9  HCT 40.4 39.1 40.4 40.6 41.2  MCV 86.5 84.1 90.2 89.6 89.8  PLT 512* 500* 513* 456* 0000000*   Basic Metabolic Panel: Recent Labs  Lab 10/12/19 2346 10/13/19 0901 10/14/19 0220 10/15/19 0355 10/16/19 0330  NA 135 137 138 140 137  K 3.9 4.1 4.5 4.2 4.3  CL 100 103 102 107 104  CO2 22 22 25 22 23   GLUCOSE 290* 274* 155* 77 79  BUN 21 21 25* 22 28*  CREATININE 0.78 0.74 0.91 0.72 0.88  CALCIUM 8.0* 7.6* 7.8* 7.9* 7.7*  MG  --  2.9*  --   --   --    GFR: Estimated Creatinine Clearance: 95.7 mL/min (by C-G formula based on SCr of 0.88 mg/dL). Liver Function Tests: Recent Labs  Lab 10/12/19 2346 10/13/19 0901 10/14/19 0220 10/15/19 0355 10/16/19 0330  AST 44* 35 29 34 40  ALT 44 40 31 32 43  ALKPHOS 55 52 48 47 46  BILITOT 0.8 0.9 0.7 0.4 1.0  PROT 7.0 7.0 6.4* 6.3* 6.1*  ALBUMIN 2.7* 2.7* 2.6* 2.5* 2.6*   No results for input(s): LIPASE, AMYLASE in the last 168 hours. No results for input(s): AMMONIA in the last 168 hours. Coagulation Profile: No results for input(s): INR, PROTIME in the last 168 hours. Cardiac Enzymes: No results for input(s): CKTOTAL, CKMB, CKMBINDEX, TROPONINI in the last 168 hours. BNP (last 3 results) No results for input(s): PROBNP in the last 8760 hours. HbA1C: Recent Labs    10/14/19 0220  HGBA1C 8.2*   CBG: Recent Labs  Lab 10/15/19 0736 10/15/19 1136 10/15/19 1549 10/15/19 2054 10/16/19 0917  GLUCAP 89 181* 243* 119* 166*   Lipid Profile: No results for input(s): CHOL, HDL, LDLCALC, TRIG, CHOLHDL, LDLDIRECT in the last 72 hours. Thyroid Function Tests: No results for input(s): TSH, T4TOTAL, FREET4, T3FREE, THYROIDAB in the  last 72 hours. Anemia Panel: No results for input(s): VITAMINB12, FOLATE, FERRITIN, TIBC, IRON, RETICCTPCT in the last 72 hours. Urine analysis:    Component Value Date/Time   APPEARANCEUR Clear 08/07/2019 1029   GLUCOSEU Negative 08/07/2019 1029   BILIRUBINUR Negative 08/07/2019 1029   PROTEINUR Negative 08/07/2019 1029   NITRITE Negative 08/07/2019 1029   LEUKOCYTESUR Negative 08/07/2019 1029   Sepsis Labs: Invalid input(s): PROCALCITONIN, LACTICIDVEN  Recent Results (from the past 240 hour(s))  Novel Coronavirus, NAA ([facility])     Status: Abnormal   Collection Time: 10/06/19 12:43 PM   Specimen: Nasopharyngeal(NP) swabs in vial transport medium   NASOPHARYNGE  TESTING  Result Value Ref Range Status   SARS-CoV-2, NAA Detected (A) Not Detected Final    Comment: This nucleic acid amplification test was developed and its performance characteristics determined by [employer]. Nucleic acid amplification tests include  PCR and TMA. This test has not been FDA cleared or approved. This test has been authorized by FDA under an Emergency Use Authorization (EUA). This test is only authorized for the duration of time the declaration that circumstances exist justifying the authorization of the emergency use of in vitro diagnostic tests for detection of SARS-CoV-2 virus and/or diagnosis of COVID-19 infection under section 564(b)(1) of the Act, 21 U.S.C. [id number]) (1), unless the authorization is terminated or revoked sooner. When diagnostic testing is negative, the possibility of a false negative result should be considered in the context of a patient's recent exposures and the presence of clinical signs and symptoms consistent with COVID-19. An individual without symptoms of COVID-19 and who is not shedding SARS-CoV-2 virus would  expect to have a negative (not detected) result in this assay.   MRSA PCR Screening     Status: None   Collection Time: 10/13/19  6:49 PM    Specimen: Nasal Mucosa; Nasopharyngeal  Result Value Ref Range Status   MRSA by PCR NEGATIVE NEGATIVE Final    Comment:        The GeneXpert MRSA Assay (FDA approved for NASAL specimens only), is one component of a comprehensive MRSA colonization surveillance program. It is not intended to diagnose MRSA infection nor to guide or monitor treatment for MRSA infections. Performed at [facility], [place] [address] [place], [place] [address]       Radiology Studies: Ct Angio Chest Pe W Or Wo Contrast  Result Date: 10/16/2019 CLINICAL DATA:  PE suspected, intermediate prob, positive d-dimer^85mL OMNIPAQUE IOHEXOL 350 MG/ML SOLNPE suspected, intermediate prob, positive D-dimer COVID positive. EXAM: CT ANGIOGRAPHY CHEST WITH CONTRAST TECHNIQUE: Multidetector CT imaging of the chest was performed using the standard protocol during bolus administration of intravenous contrast. Multiplanar CT image reconstructions and MIPs were obtained to evaluate the vascular anatomy. CONTRAST:  67mL OMNIPAQUE IOHEXOL 350 MG/ML SOLN COMPARISON:  Chest radiograph 10/13/2019. FINDINGS: Cardiovascular: There are tubular filling defects within the proximal LEFT lower lobe pulmonary arteries. Some defects are occlusive while others are partially occlusive (images 76 through 92 of series 4). Tubular filling defect within the RIGHT middle lobe pulmonary artery (image 81/4). Proximal filling defects within the RIGHT upper lobe pulmonary artery additionally on image 55/4. In the LEFT lung, small partially occlusive filling defect within the LEFT upper lobe pulmonary artery. Small distal LEFT lower lobe pulmonary artery filling defect. Overall the a clot burden is moderate to severe. There is evidence of RIGHT ventricular strain with the RIGHT ventricular diameter to LEFT ventricular diameter ratio equal 1.2. (3.8:3.3) Mediastinum/Nodes: No axillary supraclavicular adenopathy. No mediastinal hilar adenopathy.  Lungs/Pleura: No pulmonary infarction. There is diffuse ground-glass densities in a perihilar distribution. Upper Abdomen: Limited view of the liver, kidneys, pancreas are unremarkable. Normal adrenal glands. Musculoskeletal: No aggressive osseous lesion. Review of the MIP images confirms the above findings. IMPRESSION: 1. Positive for bilateral occlusive and nonocclusive acute pulmonary thromboemboli with moderate to severe clot burden primarily on the RIGHT. 2. Positive for acute PE with CT evidence of right heart strain (RV/LV Ratio = 1.2) consistent with at least submassive (intermediate risk) PE. The presence of right heart strain has been associated with an increased risk of morbidity and mortality. Please activate Code PE by paging [phone]. 3. Diffuse ground-glass densities in a perihilar distribution is consistent with viral pneumonia versus pulmonary edema. Initial page to ordering physician unsuccessful. Findings conveyed toKenny RNon 10/16/2019 at09:27. Nurse instructed to inform on-call physician. Electronically Signed  By: Suzy Bouchard M.D.   On: 10/16/2019 09:28    Marzetta Board, MD, PhD [facility]  Contact via  [email or web address]  [place] P: [phone] F: [phone]

## 2019-10-17 ENCOUNTER — Inpatient Hospital Stay (HOSPITAL_COMMUNITY): Payer: Medicare Other

## 2019-10-17 DIAGNOSIS — R0602 Shortness of breath: Secondary | ICD-10-CM

## 2019-10-17 LAB — COMPREHENSIVE METABOLIC PANEL
ALT: 45 U/L — ABNORMAL HIGH (ref 0–44)
AST: 34 U/L (ref 15–41)
Albumin: 2.8 g/dL — ABNORMAL LOW (ref 3.5–5.0)
Alkaline Phosphatase: 45 U/L (ref 38–126)
Anion gap: 8 (ref 5–15)
BUN: 27 mg/dL — ABNORMAL HIGH (ref 8–23)
CO2: 22 mmol/L (ref 22–32)
Calcium: 7.9 mg/dL — ABNORMAL LOW (ref 8.9–10.3)
Chloride: 106 mmol/L (ref 98–111)
Creatinine, Ser: 0.77 mg/dL (ref 0.61–1.24)
GFR calc Af Amer: >60 mL/min (ref >60)
GFR calc non Af Amer: >60 mL/min (ref >60)
Glucose, Bld: 108 mg/dL — ABNORMAL HIGH (ref 70–99)
Potassium: 4.2 mmol/L (ref 3.5–5.1)
Sodium: 136 mmol/L (ref 135–145)
Total Bilirubin: 1.1 mg/dL (ref 0.3–1.2)
Total Protein: 6.2 g/dL — ABNORMAL LOW (ref 6.5–8.1)

## 2019-10-17 LAB — GLUCOSE, CAPILLARY
Glucose-Capillary: 102 mg/dL — ABNORMAL HIGH (ref 70–99)
Glucose-Capillary: 195 mg/dL — ABNORMAL HIGH (ref 70–99)
Glucose-Capillary: 230 mg/dL — ABNORMAL HIGH (ref 70–99)
Glucose-Capillary: 195 mg/dL — ABNORMAL HIGH (ref 70–99)

## 2019-10-17 LAB — CBC WITH DIFFERENTIAL/PLATELET
Abs Immature Granulocytes: 0.14 10*3/uL — ABNORMAL HIGH (ref 0.00–0.07)
Basophils Absolute: 0 10*3/uL (ref 0.0–0.1)
Basophils Relative: 0 %
Eosinophils Absolute: 0 10*3/uL (ref 0.0–0.5)
Eosinophils Relative: 0 %
HCT: 42.6 % (ref 39.0–52.0)
Hemoglobin: 14.3 g/dL (ref 13.0–17.0)
Immature Granulocytes: 1 %
Lymphocytes Relative: 6 %
Lymphs Abs: 0.6 10*3/uL — ABNORMAL LOW (ref 0.7–4.0)
MCH: 29.7 pg (ref 26.0–34.0)
MCHC: 33.6 g/dL (ref 30.0–36.0)
MCV: 88.6 fL (ref 80.0–100.0)
Monocytes Absolute: 0.2 10*3/uL (ref 0.1–1.0)
Monocytes Relative: 2 %
Neutro Abs: 10.4 10*3/uL — ABNORMAL HIGH (ref 1.7–7.7)
Neutrophils Relative %: 91 %
Platelets: 445 10*3/uL — ABNORMAL HIGH (ref 150–400)
RBC: 4.81 MIL/uL (ref 4.22–5.81)
RDW: 14.1 % (ref 11.5–15.5)
WBC: 11.4 10*3/uL — ABNORMAL HIGH (ref 4.0–10.5)
nRBC: 0 % (ref 0.0–0.2)

## 2019-10-17 LAB — HEPARIN LEVEL (UNFRACTIONATED): Heparin Unfractionated: 0.65 IU/mL (ref 0.30–0.70)

## 2019-10-17 LAB — ECHOCARDIOGRAM COMPLETE
Height: 70 in
Weight: 3361.57 oz

## 2019-10-17 LAB — C-REACTIVE PROTEIN: CRP: 1.1 mg/dL — ABNORMAL HIGH (ref <1.0)

## 2019-10-17 LAB — D-DIMER, QUANTITATIVE: D-Dimer, Quant: 13.14 ug/mL-FEU — ABNORMAL HIGH (ref 0.00–0.50)

## 2019-10-17 MED ORDER — APIXABAN 5 MG PO TABS: 5.0000 mg | ORAL_TABLET | Freq: Two times a day (BID) | ORAL | Status: DC

## 2019-10-17 MED ORDER — APIXABAN 5 MG PO TABS
10.0000 mg | ORAL_TABLET | Freq: Two times a day (BID) | ORAL | Status: DC
  Administered 2019-10-17 – 2019-10-18 (×3): 10 mg via ORAL
  Filled 2019-10-17 (×3): qty 2

## 2019-10-17 NOTE — Progress Notes (Signed)
[place] for heparin Indication: pulmonary embolus  Allergies  Allergen Reactions  . Sulfonamide Derivatives     REACTION: GI Intolerance    Patient Measurements: Height: 5\' 10"  (177.8 cm) Weight: 210 lb 1.6 oz (95.3 kg) IBW/kg (Calculated) : 73 Heparin Dosing Weight: 95 kg   Vital Signs: Temp: 98 F (36.7 C) (11/27 0441) Temp Source: Oral (11/27 0441) BP: 107/69 (11/27 0441) Pulse Rate: 65 (11/27 0441)  Labs: Recent Labs    10/15/19 0355 10/16/19 0330 10/16/19 1715 10/17/19 0435  HGB 13.4 13.9  --  14.3  HCT 40.6 41.2  --  42.6  PLT 456* 420*  --  445*  HEPARINUNFRC  --   --  0.56 0.65  CREATININE 0.72 0.88  --  0.77    Estimated Creatinine Clearance: 105.2 mL/min (by C-G formula based on SCr of 0.77 mg/dL).   Assessment: 7 YOF with COVID-19 pneumonia with acute bilateral pulmonary embolism with evidence of R heart strain. Pharmacy consulted to start IV heparin.   -Heparin level remains therapeutic (0.65) on gtt at 1400 units/hr. No bleeding noted.  Goal of Therapy:  Heparin level 0.3-0.7 units/ml Monitor platelets by anticoagulation protocol: Yes   Plan:  -Continue heparin infusion at 1400 units/hr -Monitor daily HL, CBC and s/s of bleeding   Sherlon Handing, PharmD, BCPS Please see amion for complete clinical pharmacist phone list 10/17/2019 6:34 AM

## 2019-10-17 NOTE — Progress Notes (Signed)
PROGRESS NOTE  Hunter Peters [id number] DOB: [birth year] DOA: 10/13/2019 PCP: Guadalupe Maple, MD   LOS: 4 days   Brief Narrative / Interim history: 68 year old male with CAD, type 2 diabetes mellitus, Covid +11/16 came to the hospital with worsening shortness of breath, found his pulse ox was 70% at home on room air and came to the hospital.  Initially required 6 L nasal cannula.  Subjective / 24h Interval events: Feels well, has not been on oxygen since last night.  Was able to ambulate in the room few steps and feels stronger  Assessment & Plan: Active Problems:   Diabetes mellitus type 2, controlled, without complications ([place])   Hypercholesterolemia   Pneumonia due to COVID-19 virus   Acute respiratory failure with hypoxia (HCC)   Principal Problem Acute Hypoxic Respiratory Failure due to Covid-19 Viral Illness and acute pulmonary embolism -Hypoxia resolved, appears to be stable on room air -Continue remdesivir and steroids, status post remdesivir finished on 11/26 -Clinically appears to be improving  COVID-19 Labs  Recent Labs    10/15/19 0355 10/16/19 0330 10/17/19 0435  DDIMER 13.60* 13.55* 13.14*  CRP 3.8* 2.1* 1.1*    Lab Results  Component Value Date   SARSCOV2NAA Detected (A) 10/06/2019   Active Problems Acute pulmonary embolism -Diagnosed 11/26 by CT angiogram given significantly elevated D-dimer -Started on heparin yesterday, tolerated it well, no evidence of bleeding, discussed with patient regarding oral anticoagulation options Coumadin, Eliquis, Xarelto.  Per patient's preference will start Eliquis.  Pharmacy consulted.  Will ask case manager for assistance as well -2D echo pending given right strain on the CT scan, anticipate home discharge tomorrow if everything goes well today  Type 2 diabetes mellitus -A1c was 8.2, continue long-acting plus short-acting.  CBGs fairly well controlled  CBG (last 3)  Recent Labs    10/16/19 1608 10/16/19 2142  10/17/19 0728  GLUCAP 213* 94 102*   Hypertension -Continue losartan  Hyperlipidemia -Continue statin  Scheduled Meds:  apixaban  10 mg Oral BID   Followed by   Derrill Memo ON 10/24/2019] apixaban  5 mg Oral BID   dexamethasone (DECADRON) injection  6 mg Intravenous Q12H   insulin aspart  0-20 Units Subcutaneous TID WC   insulin aspart  0-5 Units Subcutaneous QHS   insulin aspart  6 Units Subcutaneous TID WC   insulin detemir  25 Units Subcutaneous BID   losartan  25 mg Oral Daily   rosuvastatin  10 mg Oral Daily   sodium chloride flush  3 mL Intravenous Q12H   vitamin C  500 mg Oral Daily   zinc sulfate  220 mg Oral Daily   Continuous Infusions:  sodium chloride     heparin Stopped (10/17/19 0827)   PRN Meds:.sodium chloride, guaiFENesin-dextromethorphan, ondansetron **OR** ondansetron (ZOFRAN) IV, polyethylene glycol, sodium chloride flush  DVT prophylaxis: Lovenox Code Status: Full code Family Communication: Discussed with patient Disposition Plan: Home within 24 hours  Consultants:  None  Procedures:  None   Microbiology: None   Antimicrobials: None   Objective: Vitals:   10/16/19 1700 10/16/19 2147 10/17/19 0441 10/17/19 0729  BP: 124/77 109/73 107/69 112/88  Pulse: 63 71 65 66  Resp:  19 19 20   Temp: 98.2 F (36.8 C) 97.8 F (36.6 C) 98 F (36.7 C) 97.6 F (36.4 C)  TempSrc: Oral Oral Oral Oral  SpO2: 91% 93% 91% 93%  Weight:      Height:        Intake/Output Summary (Last  24 hours) at 10/17/2019 0955 Last data filed at 10/17/2019 [id number] Gross per 24 hour  Intake 296 ml  Output 2600 ml  Net -2304 ml   Filed Weights   10/13/19 2257  Weight: 95.3 kg    Examination:  Constitutional: No distress, in chair Eyes: No scleral icterus ENMT: Moist mucous membranes Respiratory: No wheezing, no crackles, moves air well Cardiovascular: Regular rate and rhythm, no murmurs, no edema Abdomen: Nondistended, positive bowel  sounds Musculoskeletal: no clubbing / cyanosis. Skin: No rashes seen Neurologic: Nonfocal, equal strength  Data Reviewed: I have independently reviewed following labs and imaging studies   CBC: Recent Labs  Lab 10/13/19 0901 10/14/19 0220 10/15/19 0355 10/16/19 0330 10/17/19 0435  WBC 8.6 11.4* 8.5 10.1 11.4*  NEUTROABS 7.8* 10.6* 7.6 9.1* 10.4*  HGB 13.8 13.4 13.4 13.9 14.3  HCT 39.1 40.4 40.6 41.2 42.6  MCV 84.1 90.2 89.6 89.8 88.6  PLT 500* 513* 456* 420* [id number]*   Basic Metabolic Panel: Recent Labs  Lab 10/13/19 0901 10/14/19 0220 10/15/19 0355 10/16/19 0330 10/17/19 0435  NA 137 138 140 137 136  K 4.1 4.5 4.2 4.3 4.2  CL 103 102 107 104 106  CO2 22 25 22 23 22   GLUCOSE 274* 155* 77 79 108*  BUN 21 25* 22 28* 27*  CREATININE 0.74 0.91 0.72 0.88 0.77  CALCIUM 7.6* 7.8* 7.9* 7.7* 7.9*  MG 2.9*  --   --   --   --    GFR: Estimated Creatinine Clearance: 105.2 mL/min (by C-G formula based on SCr of 0.77 mg/dL). Liver Function Tests: Recent Labs  Lab 10/13/19 0901 10/14/19 0220 10/15/19 0355 10/16/19 0330 10/17/19 0435  AST 35 29 34 40 34  ALT 40 31 32 43 45*  ALKPHOS 52 48 47 46 45  BILITOT 0.9 0.7 0.4 1.0 1.1  PROT 7.0 6.4* 6.3* 6.1* 6.2*  ALBUMIN 2.7* 2.6* 2.5* 2.6* 2.8*   No results for input(s): LIPASE, AMYLASE in the last 168 hours. No results for input(s): AMMONIA in the last 168 hours. Coagulation Profile: No results for input(s): INR, PROTIME in the last 168 hours. Cardiac Enzymes: No results for input(s): CKTOTAL, CKMB, CKMBINDEX, TROPONINI in the last 168 hours. BNP (last 3 results) No results for input(s): PROBNP in the last 8760 hours. HbA1C: No results for input(s): HGBA1C in the last 72 hours. CBG: Recent Labs  Lab 10/16/19 0917 10/16/19 1129 10/16/19 1608 10/16/19 2142 10/17/19 0728  GLUCAP 166* 132* 213* 94 102*   Lipid Profile: No results for input(s): CHOL, HDL, LDLCALC, TRIG, CHOLHDL, LDLDIRECT in the last 72 hours. Thyroid  Function Tests: No results for input(s): TSH, T4TOTAL, FREET4, T3FREE, THYROIDAB in the last 72 hours. Anemia Panel: No results for input(s): VITAMINB12, FOLATE, FERRITIN, TIBC, IRON, RETICCTPCT in the last 72 hours. Urine analysis:    Component Value Date/Time   APPEARANCEUR Clear 08/07/2019 1029   GLUCOSEU Negative 08/07/2019 1029   BILIRUBINUR Negative 08/07/2019 1029   PROTEINUR Negative 08/07/2019 1029   NITRITE Negative 08/07/2019 1029   LEUKOCYTESUR Negative 08/07/2019 1029   Sepsis Labs: Invalid input(s): PROCALCITONIN, LACTICIDVEN  Recent Results (from the past 240 hour(s))  MRSA PCR Screening     Status: None   Collection Time: 10/13/19  6:49 PM   Specimen: Nasal Mucosa; Nasopharyngeal  Result Value Ref Range Status   MRSA by PCR NEGATIVE NEGATIVE Final    Comment:        The GeneXpert MRSA Assay (FDA approved for NASAL specimens  only), is one component of a comprehensive MRSA colonization surveillance program. It is not intended to diagnose MRSA infection nor to guide or monitor treatment for MRSA infections. Performed at [facility], [place] [address] [place], [place] [address]       Radiology Studies: Ct Angio Chest Pe W Or Wo Contrast  Result Date: 10/16/2019 CLINICAL DATA:  PE suspected, intermediate prob, positive d-dimer^64mL OMNIPAQUE IOHEXOL 350 MG/ML SOLNPE suspected, intermediate prob, positive D-dimer COVID positive. EXAM: CT ANGIOGRAPHY CHEST WITH CONTRAST TECHNIQUE: Multidetector CT imaging of the chest was performed using the standard protocol during bolus administration of intravenous contrast. Multiplanar CT image reconstructions and MIPs were obtained to evaluate the vascular anatomy. CONTRAST:  1mL OMNIPAQUE IOHEXOL 350 MG/ML SOLN COMPARISON:  Chest radiograph 10/13/2019. FINDINGS: Cardiovascular: There are tubular filling defects within the proximal LEFT lower lobe pulmonary arteries. Some defects are occlusive while others  are partially occlusive (images 76 through 92 of series 4). Tubular filling defect within the RIGHT middle lobe pulmonary artery (image 81/4). Proximal filling defects within the RIGHT upper lobe pulmonary artery additionally on image 55/4. In the LEFT lung, small partially occlusive filling defect within the LEFT upper lobe pulmonary artery. Small distal LEFT lower lobe pulmonary artery filling defect. Overall the a clot burden is moderate to severe. There is evidence of RIGHT ventricular strain with the RIGHT ventricular diameter to LEFT ventricular diameter ratio equal 1.2. (3.8:3.3) Mediastinum/Nodes: No axillary supraclavicular adenopathy. No mediastinal hilar adenopathy. Lungs/Pleura: No pulmonary infarction. There is diffuse ground-glass densities in a perihilar distribution. Upper Abdomen: Limited view of the liver, kidneys, pancreas are unremarkable. Normal adrenal glands. Musculoskeletal: No aggressive osseous lesion. Review of the MIP images confirms the above findings. IMPRESSION: 1. Positive for bilateral occlusive and nonocclusive acute pulmonary thromboemboli with moderate to severe clot burden primarily on the RIGHT. 2. Positive for acute PE with CT evidence of right heart strain (RV/LV Ratio = 1.2) consistent with at least submassive (intermediate risk) PE. The presence of right heart strain has been associated with an increased risk of morbidity and mortality. Please activate Code PE by paging [phone]. 3. Diffuse ground-glass densities in a perihilar distribution is consistent with viral pneumonia versus pulmonary edema. Initial page to ordering physician unsuccessful. Findings conveyed toKenny RNon 10/16/2019 at09:27. Nurse instructed to inform on-call physician. Electronically Signed   By: Suzy Bouchard M.D.   On: 10/16/2019 09:28    Marzetta Board, MD, PhD [facility]  Contact via  [email or web address]  [place] P: [phone] F: [phone]

## 2019-10-17 NOTE — Progress Notes (Signed)
  Echocardiogram 2D Echocardiogram has been performed.  Hunter Peters 10/17/2019, 1:40 PM

## 2019-10-17 NOTE — Progress Notes (Signed)
Patient's a.m heparin unfractionated lab result was 0.65.  Pharmacy verbally confirmed that the next blood draw will be at 0500 on 10/18/19.

## 2019-10-17 NOTE — Plan of Care (Signed)

## 2019-10-17 NOTE — Progress Notes (Signed)
[place] for heparin>apixaban Indication: pulmonary embolus  Allergies  Allergen Reactions  . Sulfonamide Derivatives     REACTION: GI Intolerance    Patient Measurements: Height: 5\' 10"  (177.8 cm) Weight: 210 lb 1.6 oz (95.3 kg) IBW/kg (Calculated) : 73 Heparin Dosing Weight: 95 kg   Vital Signs: Temp: 97.6 F (36.4 C) (11/27 0729) Temp Source: Oral (11/27 0729) BP: 112/88 (11/27 0729) Pulse Rate: 66 (11/27 0729)  Labs: Recent Labs    10/15/19 0355 10/16/19 0330 10/16/19 1715 10/17/19 0435  HGB 13.4 13.9  --  14.3  HCT 40.6 41.2  --  42.6  PLT 456* 420*  --  445*  HEPARINUNFRC  --   --  0.56 0.65  CREATININE 0.72 0.88  --  0.77    Estimated Creatinine Clearance: 105.2 mL/min (by C-G formula based on SCr of 0.77 mg/dL).   Assessment: 89 YOF with COVID-19 pneumonia with acute bilateral pulmonary embolism with evidence of R heart strain. Pharmacy consulted to transition from IV UFH to apixaban.     Plan:  -Transition to apixaban 10 mg bid x 7 days followed by 5 mg bid thereafter -Apixaban education -Monitor SCr, CBC, and s/s of bleeding   [name], PharmD, BCPS Clinical Pharmacist  10/17/2019 8:11 AM

## 2019-10-18 LAB — COMPREHENSIVE METABOLIC PANEL
ALT: 68 U/L — ABNORMAL HIGH (ref 0–44)
AST: 44 U/L — ABNORMAL HIGH (ref 15–41)
Albumin: 2.8 g/dL — ABNORMAL LOW (ref 3.5–5.0)
Alkaline Phosphatase: 47 U/L (ref 38–126)
Anion gap: 9 (ref 5–15)
BUN: 30 mg/dL — ABNORMAL HIGH (ref 8–23)
CO2: 21 mmol/L — ABNORMAL LOW (ref 22–32)
Calcium: 8.1 mg/dL — ABNORMAL LOW (ref 8.9–10.3)
Chloride: 105 mmol/L (ref 98–111)
Creatinine, Ser: 0.92 mg/dL (ref 0.61–1.24)
GFR calc Af Amer: >60 mL/min (ref >60)
GFR calc non Af Amer: >60 mL/min (ref >60)
Glucose, Bld: 120 mg/dL — ABNORMAL HIGH (ref 70–99)
Potassium: 4.2 mmol/L (ref 3.5–5.1)
Sodium: 135 mmol/L (ref 135–145)
Total Bilirubin: 0.8 mg/dL (ref 0.3–1.2)
Total Protein: 6.2 g/dL — ABNORMAL LOW (ref 6.5–8.1)

## 2019-10-18 LAB — CBC WITH DIFFERENTIAL/PLATELET
Abs Immature Granulocytes: 0.16 10*3/uL — ABNORMAL HIGH (ref 0.00–0.07)
Basophils Absolute: 0 10*3/uL (ref 0.0–0.1)
Basophils Relative: 0 %
Eosinophils Absolute: 0 10*3/uL (ref 0.0–0.5)
Eosinophils Relative: 0 %
HCT: 41.8 % (ref 39.0–52.0)
Hemoglobin: 13.8 g/dL (ref 13.0–17.0)
Immature Granulocytes: 1 %
Lymphocytes Relative: 5 %
Lymphs Abs: 0.6 10*3/uL — ABNORMAL LOW (ref 0.7–4.0)
MCH: 29.6 pg (ref 26.0–34.0)
MCHC: 33 g/dL (ref 30.0–36.0)
MCV: 89.5 fL (ref 80.0–100.0)
Monocytes Absolute: 0.2 10*3/uL (ref 0.1–1.0)
Monocytes Relative: 2 %
Neutro Abs: 10.3 10*3/uL — ABNORMAL HIGH (ref 1.7–7.7)
Neutrophils Relative %: 92 %
Platelets: 423 10*3/uL — ABNORMAL HIGH (ref 150–400)
RBC: 4.67 MIL/uL (ref 4.22–5.81)
RDW: 14.2 % (ref 11.5–15.5)
WBC: 11.3 10*3/uL — ABNORMAL HIGH (ref 4.0–10.5)
nRBC: 0 % (ref 0.0–0.2)

## 2019-10-18 LAB — C-REACTIVE PROTEIN: CRP: 0.8 mg/dL (ref <1.0)

## 2019-10-18 LAB — D-DIMER, QUANTITATIVE: D-Dimer, Quant: 11.13 ug/mL-FEU — ABNORMAL HIGH (ref 0.00–0.50)

## 2019-10-18 LAB — HEPARIN LEVEL (UNFRACTIONATED): Heparin Unfractionated: 2.01 IU/mL — ABNORMAL HIGH (ref 0.30–0.70)

## 2019-10-18 LAB — GLUCOSE, CAPILLARY: Glucose-Capillary: 124 mg/dL — ABNORMAL HIGH (ref 70–99)

## 2019-10-18 MED ORDER — FUROSEMIDE 10 MG/ML IJ SOLN
20.0000 mg | Freq: Once | INTRAMUSCULAR | Status: AC
  Administered 2019-10-18: 20 mg via INTRAVENOUS
  Filled 2019-10-18: qty 2

## 2019-10-18 MED ORDER — APIXABAN 5 MG PO TABS: ORAL_TABLET | ORAL | 0 refills | Status: DC

## 2019-10-18 MED ORDER — PREDNISONE 10 MG PO TABS: 20.0000 mg | ORAL_TABLET | Freq: Every day | ORAL | 0 refills | Status: AC

## 2019-10-18 NOTE — TOC Transition Note (Signed)
Transition of Care [facility]) - CM/SW Discharge Note   Patient Details  Name: Hunter Peters MRN: [id number] Date of Birth: January 23, 1951  [facility] CM/SW Contact:  Ninfa Meeker, RN Phone Number: 10/18/2019, 8:56 AM   Clinical Narrative:   Case manager spoke with patient via telephone  to confirm his pharmacy information. CM called [facility] in [place], provided pharmacist-Sherry with Eliquis card information: RxBin: [id number] Group: [id number], ID: [id number]. Case manager printed copay card and 30 day free Eliquis card to the nurisng unit, Charge nurse asked to give to patient. No further needs identified.          Patient Goals and CMS Choice        Discharge Placement                       Discharge Plan and Services                                     Social Determinants of Health (SDOH) Interventions     Readmission Risk Interventions No flowsheet data found.

## 2019-10-18 NOTE — Discharge Instructions (Addendum)
Information on my medicine - ELIQUIS (apixaban)  This medication education was reviewed with me or my healthcare representative as part of my discharge preparation.  The pharmacist that spoke with me during my hospital stay was: Hunter Peters, PharmD, BCPS    Why was Eliquis prescribed for you? Eliquis was prescribed to treat blood clots that may have been found in the veins of your legs (deep vein thrombosis) or in your lungs (pulmonary embolism) and to reduce the risk of them occurring again.  What do You need to know about Eliquis ? The starting dose is 10 mg (two 5 mg tablets) taken TWICE daily for the FIRST SEVEN (7) DAYS, then on  12/4  the dose is reduced to ONE 5 mg tablet taken TWICE daily.  Eliquis may be taken with or without food.   Try to take the dose about the same time in the morning and in the evening. If you have difficulty swallowing the tablet whole please discuss with your pharmacist how to take the medication safely.  Take Eliquis exactly as prescribed and DO NOT stop taking Eliquis without talking to the doctor who prescribed the medication.  Stopping may increase your risk of developing a new blood clot.  Refill your prescription before you run out.  After discharge, you should have regular check-up appointments with your healthcare provider that is prescribing your Eliquis.    What do you do if you miss a dose? If a dose of ELIQUIS is not taken at the scheduled time, take it as soon as possible on the same day and twice-daily administration should be resumed. The dose should not be doubled to make up for a missed dose.  Important Safety Information A possible side effect of Eliquis is bleeding. You should call your healthcare provider right away if you experience any of the following: ? Bleeding from an injury or your nose that does not stop. ? Unusual colored urine (red or dark brown) or unusual colored stools (red or black). ? Unusual bruising for unknown  reasons. ? A serious fall or if you hit your head (even if there is no bleeding).  Some medicines may interact with Eliquis and might increase your risk of bleeding or clotting while on Eliquis. To help avoid this, consult your healthcare provider or pharmacist prior to using any new prescription or non-prescription medications, including herbals, vitamins, non-steroidal anti-inflammatory drugs (NSAIDs) and supplements.  This website has more information on Eliquis (apixaban): [email or web address]  Follow with Hunter Maple, MD in 2 weeks  Please get a complete blood count and chemistry panel checked by your Primary MD at your next visit, and again as instructed by your Primary MD. Please get your medications reviewed and adjusted by your Primary MD.  Please request your Primary MD to go over all Hospital Tests and Procedure/Radiological results at the follow up, please get all Hospital records sent to your Prim MD by signing hospital release before you go home.  In some cases, there will be blood work, cultures and biopsy results pending at the time of your discharge. Please request that your primary care M.D. goes through all the records of your hospital data and follows up on these results.  If you had Pneumonia of Lung problems at the Hospital: Please get a 2 view Chest X ray done in 6-8 weeks after hospital discharge or sooner if instructed by your Primary MD.  If you have Congestive Heart Failure: Please call your Cardiologist or Primary MD anytime  you have any of the following symptoms:  1) 3 pound weight gain in 24 hours or 5 pounds in 1 week  2) shortness of breath, with or without a dry hacking cough  3) swelling in the hands, feet or stomach  4) if you have to sleep on extra pillows at night in order to breathe  Follow cardiac low salt diet and 1.5 lit/day fluid restriction.  If you have diabetes Accuchecks 4 times/day, Once in AM empty stomach and then  before each meal. Log in all results and show them to your primary doctor at your next visit. If any glucose reading is under 80 or above 300 call your primary MD immediately.  If you have Seizure/Convulsions/Epilepsy: Please do not drive, operate heavy machinery, participate in activities at heights or participate in high speed sports until you have seen by Primary MD or a Neurologist and advised to do so again. Per [facility] statutes, patients with seizures are not allowed to drive until they have been seizure-free for six months.  Use caution when using heavy equipment or power tools. Avoid working on ladders or at heights. Take showers instead of baths. Ensure the water temperature is not too high on the home water heater. Do not go swimming alone. Do not lock yourself in a room alone (i.e. bathroom). When caring for infants or small children, sit down when holding, feeding, or changing them to minimize risk of injury to the child in the event you have a seizure. Maintain good sleep hygiene. Avoid alcohol.   If you had Gastrointestinal Bleeding: Please ask your Primary MD to check a complete blood count within one week of discharge or at your next visit. Your endoscopic/colonoscopic biopsies that are pending at the time of discharge, will also need to followed by your Primary MD.  Get Medicines reviewed and adjusted. Please take all your medications with you for your next visit with your Primary MD  Please request your Primary MD to go over all hospital tests and procedure/radiological results at the follow up, please ask your Primary MD to get all Hospital records sent to his/her office.  If you experience worsening of your admission symptoms, develop shortness of breath, life threatening emergency, suicidal or homicidal thoughts you must seek medical attention immediately by calling 911 or calling your MD immediately  if symptoms less severe.  You must read complete  instructions/literature along with all the possible adverse reactions/side effects for all the Medicines you take and that have been prescribed to you. Take any new Medicines after you have completely understood and accpet all the possible adverse reactions/side effects.   Do not drive or operate heavy machinery when taking Pain medications.   Do not take more than prescribed Pain, Sleep and Anxiety Medications  Special Instructions: If you have smoked or chewed Tobacco  in the last 2 yrs please stop smoking, stop any regular Alcohol  and or any Recreational drug use.  Wear Seat belts while driving.  Please note You were cared for by a hospitalist during your hospital stay. If you have any questions about your discharge medications or the care you received while you were in the hospital after you are discharged, you can call the unit and asked to speak with the hospitalist on call if the hospitalist that took care of you is not available. Once you are discharged, your primary care physician will handle any further medical issues. Please note that NO REFILLS for any discharge medications will  be authorized once you are discharged, as it is imperative that you return to your primary care physician (or establish a relationship with a primary care physician if you do not have one) for your aftercare needs so that they can reassess your need for medications and monitor your lab values.  You can reach the hospitalist office at phone [phone] or fax [phone]   If you do not have a primary care physician, you can call [phone] for a physician referral.  Activity: As tolerated with Full fall precautions use walker/cane & assistance as needed    Diet: diabetic  Disposition Home   People who have recovered from the coronavirus, can help a person still fighting the virus and potentially help them recover by giving them convalescent plasma.  What is COVID-19 convalescent plasma? Convalescent  plasma (CPP) is plasma collected from people who have recovered from the coronavirus. People who recover from coronavirus infection have developed antibodies to the virus that remain in the plasma portion of their blood. Transfusing the plasma that contains the antibodies into a person still fighting the virus can provide a boost to the patients immune system and potentially help them recover. The experimental treatment is approved by the FDA to be used on an emergency basis and is called COVID-19 convalescent plasma." Critically ill patients who meet the FDA criteria to receive this therapy can be treated for life-threatening COVID-19.  Can I donate convalescent plasma or blood after being diagnosed with COVID-19? Donors must meet all the required screening criteria for blood donation and the additional FDA criteria, as follows:   Prior diagnosis of COVID-19 documented by an FDA approved laboratory test   A positive diagnostic test at the time of illness OR   A positive serological test for SARS-CoV-2 antibodies after recovery  Complete resolution of symptoms at least 14 days prior to donation and a documented negative COVID-19 FDA approved test OR   Complete resolution of symptoms at least 28 days prior to donation As a part of your pre-donation process you will be required to provide your COVID-19 test result(s).  Where can I donate COVID-19 convalescent plasma? You can complete the pre-donation form at [email or web address] and a donor specialist will be in contact within 24-48 hours.  What is the process for donating COVID-19 Convalescent Plasma?  1. The first step in the donation process is to obtain a copy of your test results or a letter from the testing facility notifying you of your positive result and the date it was taken 2. Complete the pre-donation form at [email or web address] 3. A representative will be in contact within 24-48 hours to provide additional insight into the  process 4. Once the process begins, [facility] will ensure you   Meet the required screening criteria for blood donation   Have the required test results   Meet the criteria for resolution of your symptoms   Complete resolution of symptoms at least 14 days prior to donation and a documented negative COVID-19 FDA approved test OR   Complete resolution of symptoms at least 28 days prior to donation 5. [facility] will then schedule a collection date and time to collect your COVID-19 convalescent plasma  Is the COVID-19 convalescent plasma donation safe? It is safe to donate COVID-19 convalescent plasma and done in the same way that thousands of blood donors donate blood products every day.  My family member or friend is being treated for COVID-19. Can I donate to help them if I am eligible? A directed  donation will need to be arranged in conjunction with your family members care provider.  The care provider will determine whether COVID-19 convalescent plasma is the best course of treatment   A directed donation must be arranged through the Clinician Referral page that can be found at [email or web address]   The clinician will need to provide [facility] with information about both you and the patient for which the donation is directed   Please note there are additional qualifications that are required such as your blood type and the patients blood type We encourage you to consider beginning the pre-registration process even if your loved one will not be receiving COVID-19 convalescent plasma as a part of their medical treatment. The need for COVID-19 convalescent plasma increases daily and your donated COVID-19 convalescent plasma could be life-saving for another COVID-19 patient.  Please go to the website [email or web address] if you would like to consider volunteer for plasma donation.       Person Under Monitoring Name: Hunter Peters  Location: [place]  [address]   Infection Prevention Recommendations for Individuals Confirmed to have, or Being Evaluated for, 2019 Novel Coronavirus (COVID-19) Infection Who Receive Care at Home  Individuals who are confirmed to have, or are being evaluated for, COVID-19 should follow the prevention steps below until a healthcare provider or local or state health department says they can return to normal activities.  Stay home except to get medical care You should restrict activities outside your home, except for getting medical care. Do not go to work, school, or public areas, and do not use public transportation or taxis.  Call ahead before visiting your doctor Before your medical appointment, call the healthcare provider and tell them that you have, or are being evaluated for, COVID-19 infection. This will help the healthcare providers office take steps to keep other people from getting infected. Ask your healthcare provider to call the local or state health department.  Monitor your symptoms Seek prompt medical attention if your illness is worsening (e.g., difficulty breathing). Before going to your medical appointment, call the healthcare provider and tell them that you have, or are being evaluated for, COVID-19 infection. Ask your healthcare provider to call the local or state health department.  Wear a facemask You should wear a facemask that covers your nose and mouth when you are in the same room with other people and when you visit a healthcare provider. People who live with or visit you should also wear a facemask while they are in the same room with you.  Separate yourself from other people in your home As much as possible, you should stay in a different room from other people in your home. Also, you should use a separate bathroom, if available.  Avoid sharing household items You should not share dishes, drinking glasses, cups, eating utensils, towels, bedding, or other items with other  people in your home. After using these items, you should wash them thoroughly with soap and water.  Cover your coughs and sneezes Cover your mouth and nose with a tissue when you cough or sneeze, or you can cough or sneeze into your sleeve. Throw used tissues in a lined trash can, and immediately wash your hands with soap and water for at least 20 seconds or use an alcohol-based hand rub.  Wash your [facility] your hands often and thoroughly with soap and water for at least 20 seconds. You can use an alcohol-based hand sanitizer if soap and water  are not available and if your hands are not visibly dirty. Avoid touching your eyes, nose, and mouth with unwashed hands.   Prevention Steps for Caregivers and Household Members of Individuals Confirmed to have, or Being Evaluated for, COVID-19 Infection Being Cared for in the Home  If you live with, or provide care at home for, a person confirmed to have, or being evaluated for, COVID-19 infection please follow these guidelines to prevent infection:  Follow healthcare providers instructions Make sure that you understand and can help the patient follow any healthcare provider instructions for all care.  Provide for the patients basic needs You should help the patient with basic needs in the home and provide support for getting groceries, prescriptions, and other personal needs.  Monitor the patients symptoms If they are getting sicker, call his or her medical provider and tell them that the patient has, or is being evaluated for, COVID-19 infection. This will help the healthcare providers office take steps to keep other people from getting infected. Ask the healthcare provider to call the local or state health department.  Limit the number of people who have contact with the patient  If possible, have only one caregiver for the patient.  Other household members should stay in another home or place of residence. If this is not  possible, they should stay  in another room, or be separated from the patient as much as possible. Use a separate bathroom, if available.  Restrict visitors who do not have an essential need to be in the home.  Keep older adults, very young children, and other sick people away from the patient Keep older adults, very young children, and those who have compromised immune systems or chronic health conditions away from the patient. This includes people with chronic heart, lung, or kidney conditions, diabetes, and cancer.  Ensure good ventilation Make sure that shared spaces in the home have good air flow, such as from an air conditioner or an opened window, weather permitting.  Wash your hands often  Wash your hands often and thoroughly with soap and water for at least 20 seconds. You can use an alcohol based hand sanitizer if soap and water are not available and if your hands are not visibly dirty.  Avoid touching your eyes, nose, and mouth with unwashed hands.  Use disposable paper towels to dry your hands. If not available, use dedicated cloth towels and replace them when they become wet.  Wear a facemask and gloves  Wear a disposable facemask at all times in the room and gloves when you touch or have contact with the patients blood, body fluids, and/or secretions or excretions, such as sweat, saliva, sputum, nasal mucus, vomit, urine, or feces.  Ensure the mask fits over your nose and mouth tightly, and do not touch it during use.  Throw out disposable facemasks and gloves after using them. Do not reuse.  Wash your hands immediately after removing your facemask and gloves.  If your personal clothing becomes contaminated, carefully remove clothing and launder. Wash your hands after handling contaminated clothing.  Place all used disposable facemasks, gloves, and other waste in a lined container before disposing them with other household waste.  Remove gloves and wash your hands  immediately after handling these items.  Do not share dishes, glasses, or other household items with the patient  Avoid sharing household items. You should not share dishes, drinking glasses, cups, eating utensils, towels, bedding, or other items with a patient who is confirmed  to have, or being evaluated for, COVID-19 infection.  After the person uses these items, you should wash them thoroughly with soap and water.  Wash laundry thoroughly  Immediately remove and wash clothes or bedding that have blood, body fluids, and/or secretions or excretions, such as sweat, saliva, sputum, nasal mucus, vomit, urine, or feces, on them.  Wear gloves when handling laundry from the patient.  Read and follow directions on labels of laundry or clothing items and detergent. In general, wash and dry with the warmest temperatures recommended on the label.  Clean all areas the individual has used often  Clean all touchable surfaces, such as counters, tabletops, doorknobs, bathroom fixtures, toilets, phones, keyboards, tablets, and bedside tables, every day. Also, clean any surfaces that may have blood, body fluids, and/or secretions or excretions on them.  Wear gloves when cleaning surfaces the patient has come in contact with.  Use a diluted bleach solution (e.g., dilute bleach with 1 part bleach and 10 parts water) or a household disinfectant with a label that says EPA-registered for coronaviruses. To make a bleach solution at home, add 1 tablespoon of bleach to 1 quart (4 cups) of water. For a larger supply, add  cup of bleach to 1 gallon (16 cups) of water.  Read labels of cleaning products and follow recommendations provided on product labels. Labels contain instructions for safe and effective use of the cleaning product including precautions you should take when applying the product, such as wearing gloves or eye protection and making sure you have good ventilation during use of the product.  Remove  gloves and wash hands immediately after cleaning.  Monitor yourself for signs and symptoms of illness Caregivers and household members are considered close contacts, should monitor their health, and will be asked to limit movement outside of the home to the extent possible. Follow the monitoring steps for close contacts listed on the symptom monitoring form.   ? If you have additional questions, contact your local health department or call the epidemiologist on call at [phone] (available 24/7). ? This guidance is subject to change. For the most up-to-date guidance from [facility], please refer to their website: [email or web address]

## 2019-10-18 NOTE — Discharge Summary (Signed)
Physician Discharge Summary  Hunter Peters [id number] DOB: 07-14-1951 DOA: 10/13/2019  PCP: Guadalupe Maple, MD  Admit date: 10/13/2019 Discharge date: 10/18/2019  Admitted From: home Disposition:  home  Recommendations for Outpatient Follow-up:  1. Follow up with PCP in 1-2 weeks  Home Health: None Equipment/Devices: None  Discharge Condition: Stable CODE STATUS: Full code Diet recommendation: Diabetic  HPI: Per admitting MD, Hunter Peters is a 68 y.o. male past medical history of CAD, diabetes mellitus type 2 who tested Positive for COVID-19 on 10/06/2019 he relates he started having shortness of breath about a week ago that is why he got tested.  He relates he traveled to [place] for a golfing trip afterwards felt extremely fatigued and generalized weakness for 5 days.  He saw his PCP on 10/09/2019 and started him on steroids and acyclovir for possible herpes zoster since he had some tingling on his left frontal thoracic region.  Had a pulse ox at home and his saturations were 70% on room air he decided to called EMS where he was taken to the ED and it was confirmed that he was hypoxic in the ED he was placed on 6 L and saturations improved to 94%.  He was started on IV Decadron on remdesivir. In the ED at [facility]: His CRP was 13 D-dimer is pending he was started on IV remdesivir and steroids.  Because elevated LFTs within normal limits.  Lactic acid was 2.1 his procalcitonin was less than 0.1.  Hospital Course / Discharge diagnoses: Acute Hypoxic Respiratory Failure due to Covid-19 Viral Illness -patient was admitted to the hospital with hypoxic respiratory failure requiring supplemental oxygen.  Chest x-ray on admission showed multifocal pneumonia. Patient was placed on remdesivir and completed a 5-day course while hospitalized.  He was also placed on steroids.  With treatment his clinical condition improved, his hypoxia resolved and he was able to be weaned off to room air.  He is  able to ambulate without hypoxia, significant shortness of breath and has returned to baseline.  He will be discharged home in stable condition with 4 additional days of steroids.  COVID-19 Labs  Recent Labs    10/16/19 0330 10/17/19 0435 10/18/19 0149  DDIMER 13.55* 13.14* 11.13*  CRP 2.1* 1.1* 0.8    Lab Results  Component Value Date   SARSCOV2NAA Detected (A) 10/06/2019   Acute pulmonary embolism -while hospitalized patient had a significant spike in his D-dimer, and given persistent shortness of breath with activity he underwent a CT angiogram on 11/26 which showed pulmonary embolism.  CT scan suggested right heart strain and patient underwent a 2D echo which was fairly unremarkable (full results below).  Patient tolerated heparin well with no evidence of bleeding and stable counts, and he was placed on Eliquis.  He will need probably at least 6 months of treatment but will defer further management to his PCP. Type 2 diabetes mellitus -A1c was 8.2, patient would benefit from continued control as an outpatient Hypertension -Continue losartan Hyperlipidemia -Continue statin  Discharge Instructions   Allergies as of 10/18/2019      Reactions   Sulfonamide Derivatives    REACTION: GI Intolerance      Medication List    TAKE these medications   apixaban 5 MG Tabs tablet Commonly known as: Eliquis Take 2 tablets (10mg ) twice daily for 6 days, then 1 tablet (5mg ) twice daily   betamethasone dipropionate 0.05 % cream Apply topically 2 (two) times daily. What changed:  when to take this  reasons to take this   losartan 25 MG tablet Commonly known as: COZAAR Take 1 tablet (25 mg total) by mouth daily.   metFORMIN 500 MG tablet Commonly known as: GLUCOPHAGE Take 2 tablets (1,000 mg total) by mouth 2 (two) times daily with a meal.   predniSONE 10 MG tablet Commonly known as: DELTASONE Take 2 tablets (20 mg total) by mouth daily with breakfast for 4 days. What  changed:   how much to take  how to take this  when to take this  additional instructions   rosuvastatin 10 MG tablet Commonly known as: Crestor Take 1 tablet (10 mg total) by mouth daily.   valACYclovir 1000 MG tablet Commonly known as: VALTREX Take 1 tablet (1,000 mg total) by mouth 2 (two) times daily.      Follow-up Information    Crissman, Jeannette How, MD. Schedule an appointment as soon as possible for a visit in 2 week(s).   Specialty: Family Medicine Contact information: [place] [address] [phone]           Consultations:  None   Procedures/Studies:  2D echo  IMPRESSIONS  1. Left ventricular ejection fraction, by visual estimation, is 60 to 65%. The left ventricle has normal function. There is borderline left ventricular hypertrophy.  2. The left ventricle has no regional wall motion abnormalities.  3. Global right ventricle has normal systolic function.The right ventricular size is normal. No increase in right ventricular wall thickness.  4. Left atrial size was normal.  5. Right atrial size was normal.  6. Presence of pericardial fat pad.  7. The mitral valve is grossly normal. No evidence of mitral valve regurgitation.  8. The tricuspid valve is grossly normal. Tricuspid valve regurgitation is trivial.  9. The aortic valve is tricuspid. Aortic valve regurgitation is not visualized. No evidence of aortic valve sclerosis or stenosis. 10. The pulmonic valve was grossly normal. Pulmonic valve regurgitation is not visualized. 11. TR signal is inadequate for assessing pulmonary artery systolic pressure. 12. The inferior vena cava is normal in size with greater than 50% respiratory variability, suggesting right atrial pressure of 3 mmHg.   Ct Angio Chest Pe W Or Wo Contrast  Result Date: 10/16/2019 CLINICAL DATA:  PE suspected, intermediate prob, positive d-dimer^48mL OMNIPAQUE IOHEXOL 350 MG/ML SOLNPE suspected, intermediate prob,  positive D-dimer COVID positive. EXAM: CT ANGIOGRAPHY CHEST WITH CONTRAST TECHNIQUE: Multidetector CT imaging of the chest was performed using the standard protocol during bolus administration of intravenous contrast. Multiplanar CT image reconstructions and MIPs were obtained to evaluate the vascular anatomy. CONTRAST:  66mL OMNIPAQUE IOHEXOL 350 MG/ML SOLN COMPARISON:  Chest radiograph 10/13/2019. FINDINGS: Cardiovascular: There are tubular filling defects within the proximal LEFT lower lobe pulmonary arteries. Some defects are occlusive while others are partially occlusive (images 76 through 92 of series 4). Tubular filling defect within the RIGHT middle lobe pulmonary artery (image 81/4). Proximal filling defects within the RIGHT upper lobe pulmonary artery additionally on image 55/4. In the LEFT lung, small partially occlusive filling defect within the LEFT upper lobe pulmonary artery. Small distal LEFT lower lobe pulmonary artery filling defect. Overall the a clot burden is moderate to severe. There is evidence of RIGHT ventricular strain with the RIGHT ventricular diameter to LEFT ventricular diameter ratio equal 1.2. (3.8:3.3) Mediastinum/Nodes: No axillary supraclavicular adenopathy. No mediastinal hilar adenopathy. Lungs/Pleura: No pulmonary infarction. There is diffuse ground-glass densities in a perihilar distribution. Upper Abdomen: Limited view of  the liver, kidneys, pancreas are unremarkable. Normal adrenal glands. Musculoskeletal: No aggressive osseous lesion. Review of the MIP images confirms the above findings. IMPRESSION: 1. Positive for bilateral occlusive and nonocclusive acute pulmonary thromboemboli with moderate to severe clot burden primarily on the RIGHT. 2. Positive for acute PE with CT evidence of right heart strain (RV/LV Ratio = 1.2) consistent with at least submassive (intermediate risk) PE. The presence of right heart strain has been associated with an increased risk of morbidity and  mortality. Please activate Code PE by paging [phone]. 3. Diffuse ground-glass densities in a perihilar distribution is consistent with viral pneumonia versus pulmonary edema. Initial page to ordering physician unsuccessful. Findings conveyed toKenny RNon 10/16/2019 at09:27. Nurse instructed to inform on-call physician. Electronically Signed   By: Suzy Bouchard M.D.   On: 10/16/2019 09:28   Dg Chest Port 1 View  Result Date: 10/13/2019 CLINICAL DATA:  COVID-19, worsening dyspnea EXAM: PORTABLE CHEST 1 VIEW COMPARISON:  Coronary CT 09/04/2019, chest radiograph 05/17/2010 FINDINGS: Multifocal areas of interstitial and airspace opacity throughout both lungs with diminished volumes and streaky basilar areas of subsegmental atelectasis. No pneumothorax or effusion. Cardiomediastinal prominence likely accentuated by portable technique. No acute osseous or soft tissue abnormality. Degenerative changes are present in the imaged spine and shoulders. IMPRESSION: Multifocal areas of interstitial and airspace opacity throughout both lungs, compatible with multifocal pneumonia. Electronically Signed   By: Lovena Le M.D.   On: 10/13/2019 00:18     Subjective: - no chest pain, shortness of breath, no abdominal pain, nausea or vomiting.   Discharge Exam: BP 90/74 (BP Location: Right Arm)    Pulse 63    Temp (!) 97.4 F (36.3 C) (Axillary)    Resp 19    Ht 5\' 10"  (1.778 m)    Wt 95.3 kg    SpO2 93%    BMI 30.15 kg/m   General: Pt is alert, awake, not in acute distress Cardiovascular: RRR, S1/S2 +, no rubs, no gallops Respiratory: CTA bilaterally, no wheezing, no rhonchi Abdominal: Soft, NT, ND, bowel sounds + Extremities: no edema, no cyanosis   The results of significant diagnostics from this hospitalization (including imaging, microbiology, ancillary and laboratory) are listed below for reference.     Microbiology: Recent Results (from the past 240 hour(s))  MRSA PCR Screening     Status:  None   Collection Time: 10/13/19  6:49 PM   Specimen: Nasal Mucosa; Nasopharyngeal  Result Value Ref Range Status   MRSA by PCR NEGATIVE NEGATIVE Final    Comment:        The GeneXpert MRSA Assay (FDA approved for NASAL specimens only), is one component of a comprehensive MRSA colonization surveillance program. It is not intended to diagnose MRSA infection nor to guide or monitor treatment for MRSA infections. Performed at [facility], [place] [address] [place], [place] [address]      Labs: Basic Metabolic Panel: Recent Labs  Lab 10/13/19 0901 10/14/19 0220 10/15/19 0355 10/16/19 0330 10/17/19 0435 10/18/19 0149  NA 137 138 140 137 136 135  K 4.1 4.5 4.2 4.3 4.2 4.2  CL 103 102 107 104 106 105  CO2 22 25 22 23 22  21*  GLUCOSE 274* 155* 77 79 108* 120*  BUN 21 25* 22 28* 27* 30*  CREATININE 0.74 0.91 0.72 0.88 0.77 0.92  CALCIUM 7.6* 7.8* 7.9* 7.7* 7.9* 8.1*  MG 2.9*  --   --   --   --   --  Liver Function Tests: Recent Labs  Lab 10/14/19 0220 10/15/19 0355 10/16/19 0330 10/17/19 0435 10/18/19 0149  AST 29 34 40 34 44*  ALT 31 32 43 45* 68*  ALKPHOS 48 47 46 45 47  BILITOT 0.7 0.4 1.0 1.1 0.8  PROT 6.4* 6.3* 6.1* 6.2* 6.2*  ALBUMIN 2.6* 2.5* 2.6* 2.8* 2.8*   CBC: Recent Labs  Lab 10/14/19 0220 10/15/19 0355 10/16/19 0330 10/17/19 0435 10/18/19 0149  WBC 11.4* 8.5 10.1 11.4* 11.3*  NEUTROABS 10.6* 7.6 9.1* 10.4* 10.3*  HGB 13.4 13.4 13.9 14.3 13.8  HCT 40.4 40.6 41.2 42.6 41.8  MCV 90.2 89.6 89.8 88.6 89.5  PLT 513* 456* 420* 445* 423*   CBG: Recent Labs  Lab 10/17/19 0728 10/17/19 1148 10/17/19 1605 10/17/19 2108 10/18/19 0725  GLUCAP 102* 195* 230* 195* 124*   Hgb A1c No results for input(s): HGBA1C in the last 72 hours. Lipid Profile No results for input(s): CHOL, HDL, LDLCALC, TRIG, CHOLHDL, LDLDIRECT in the last 72 hours. Thyroid function studies No results for input(s): TSH, T4TOTAL, T3FREE, THYROIDAB in the  last 72 hours.  Invalid input(s): FREET3 Urinalysis    Component Value Date/Time   APPEARANCEUR Clear 08/07/2019 1029   GLUCOSEU Negative 08/07/2019 1029   BILIRUBINUR Negative 08/07/2019 1029   PROTEINUR Negative 08/07/2019 1029   NITRITE Negative 08/07/2019 1029   LEUKOCYTESUR Negative 08/07/2019 1029    FURTHER DISCHARGE INSTRUCTIONS:   Get Medicines reviewed and adjusted: Please take all your medications with you for your next visit with your Primary MD   Laboratory/radiological data: Please request your Primary MD to go over all hospital tests and procedure/radiological results at the follow up, please ask your Primary MD to get all Hospital records sent to his/her office.   In some cases, they will be blood work, cultures and biopsy results pending at the time of your discharge. Please request that your primary care M.D. goes through all the records of your hospital data and follows up on these results.   Also Note the following: If you experience worsening of your admission symptoms, develop shortness of breath, life threatening emergency, suicidal or homicidal thoughts you must seek medical attention immediately by calling 911 or calling your MD immediately  if symptoms less severe.   You must read complete instructions/literature along with all the possible adverse reactions/side effects for all the Medicines you take and that have been prescribed to you. Take any new Medicines after you have completely understood and accpet all the possible adverse reactions/side effects.    Do not drive when taking Pain medications or sleeping medications (Benzodaizepines)   Do not take more than prescribed Pain, Sleep and Anxiety Medications. It is not advisable to combine anxiety,sleep and pain medications without talking with your primary care practitioner   Special Instructions: If you have smoked or chewed Tobacco  in the last 2 yrs please stop smoking, stop any regular Alcohol  and  or any Recreational drug use.   Wear Seat belts while driving.   Please note: You were cared for by a hospitalist during your hospital stay. Once you are discharged, your primary care physician will handle any further medical issues. Please note that NO REFILLS for any discharge medications will be authorized once you are discharged, as it is imperative that you return to your primary care physician (or establish a relationship with a primary care physician if you do not have one) for your post hospital discharge needs so that they can reassess your  need for medications and monitor your lab values.  Time coordinating discharge: 35 minutes  SIGNED:  Marzetta Board, MD, PhD 10/18/2019, 11:08 AM

## 2019-10-18 NOTE — Progress Notes (Signed)
Patient educated on discharge papers. Voucher given for Elliquis. Questions answered.IV taken out.

## 2019-10-20 ENCOUNTER — Telehealth: Payer: Self-pay

## 2019-10-20 NOTE — Telephone Encounter (Signed)
Transition Care Management Follow-up Telephone Call  Date of discharge and from where: [facility] 10/22/2019  How have you been since you were released from the hospital? "as expected since being in the hospital 6 days"  Any questions or concerns? Yes, eliquis was expensive, I got a 30 day supply but would like to discuss at my hospital follow up for refills.   Items Reviewed:  Did the pt receive and understand the discharge instructions provided? Yes   Medications obtained and verified? No   Any new allergies since your discharge? No   Dietary orders reviewed? Yes  Do you have support at home? Yes   Functional Questionnaire: (I = Independent and D = Dependent) ADLs:   Bathing/Dressing- i  Meal Prep- i  Eating- i  Maintaining continence- i  Transferring/Ambulation- i  Managing Meds- i  Follow up appointments reviewed:   PCP Hospital f/u appt confirmed? Yes  Scheduled to see Dr.Johnson  on 10/27/2019 @ 2:30pm .  [facility] f/u appt confirmed? n/a  Are transportation arrangements needed? No   If their condition worsens, is the pt aware to call PCP or go to the Emergency Dept.? Yes  Was the patient provided with contact information for the PCP's office or ED? Yes  Was to pt encouraged to call back with questions or concerns? Yes

## 2019-10-27 ENCOUNTER — Other Ambulatory Visit: Payer: Self-pay

## 2019-10-27 ENCOUNTER — Ambulatory Visit (INDEPENDENT_AMBULATORY_CARE_PROVIDER_SITE_OTHER): Payer: Medicare Other | Admitting: Family Medicine

## 2019-10-27 ENCOUNTER — Encounter: Payer: Self-pay | Admitting: Family Medicine

## 2019-10-27 VITALS — HR 101 | Temp 96.4°F

## 2019-10-27 DIAGNOSIS — I2699 Other pulmonary embolism without acute cor pulmonale: Secondary | ICD-10-CM | POA: Diagnosis not present

## 2019-10-27 DIAGNOSIS — E119 Type 2 diabetes mellitus without complications: Secondary | ICD-10-CM

## 2019-10-27 DIAGNOSIS — U071 COVID-19: Secondary | ICD-10-CM

## 2019-10-27 DIAGNOSIS — J1289 Other viral pneumonia: Secondary | ICD-10-CM | POA: Diagnosis not present

## 2019-10-27 DIAGNOSIS — J1282 Pneumonia due to coronavirus disease 2019: Secondary | ICD-10-CM

## 2019-10-27 MED ORDER — APIXABAN 5 MG PO TABS: ORAL_TABLET | ORAL | 4 refills | Status: DC

## 2019-10-27 NOTE — Assessment & Plan Note (Signed)
Likely due to COVID. Will check hypercoagulable work up at next visit. He would like to repeat d-dimer. Currently on eliquis- quite expensive. Xarelto less expensive, but he doesn't really want to change. Rx sent to his pharmacy- we will await PA. Continue to monitor. Likely will anticoagulate for 6 months. Call with any concerns.

## 2019-10-27 NOTE — Progress Notes (Signed)
Pulse (!) 101   Temp (!) 96.4 F (35.8 C)   SpO2 95%    Subjective:    Patient ID: Hunter Peters, male    DOB: 1951-08-28, 68 y.o.   MRN: [id number]  HPI: Hunter Peters is a 68 y.o. male  Chief Complaint  Patient presents with  . Hospitalization Follow-up    COVID Positive   Transition of Care Hospital Follow up.   Hospital/Facility: [name] Physician:  Dr. Posey Pronto D/C Date: 10/18/19  Records Requested: 10/27/19 Records Received: 10/27/19 Records Reviewed: 10/27/19  Diagnoses on Discharge:  Pneumonia due to COVID-19 virus, Acute Pulmonary Embolism  Date of interactive Contact within 48 hours of discharge: 10/20/19 Contact was through: phone  Date of 7 day or 14 day face-to-face visit: 10/27/19  within 14 days  Outpatient Encounter Medications as of 10/27/2019  Medication Sig  . Ascorbic Acid (VITAMIN C) 500 MG CAPS Take by mouth.  . Zinc 50 MG TABS Take by mouth.  Marland Kitchen apixaban (ELIQUIS) 5 MG TABS tablet Take 2 tablets (10mg ) twice daily for 6 days, then 1 tablet (5mg ) twice daily  . losartan (COZAAR) 25 MG tablet Take 1 tablet (25 mg total) by mouth daily. (Patient not taking: Reported on 10/27/2019)  . metFORMIN (GLUCOPHAGE) 500 MG tablet Take 2 tablets (1,000 mg total) by mouth 2 (two) times daily with a meal.  . rosuvastatin (CRESTOR) 10 MG tablet Take 1 tablet (10 mg total) by mouth daily.  . [DISCONTINUED] apixaban (ELIQUIS) 5 MG TABS tablet Take 2 tablets (10mg ) twice daily for 6 days, then 1 tablet (5mg ) twice daily  . [DISCONTINUED] betamethasone dipropionate (DIPROLENE) 0.05 % cream Apply topically 2 (two) times daily. (Patient taking differently: Apply topically 2 (two) times daily as needed. )  . [DISCONTINUED] valACYclovir (VALTREX) 1000 MG tablet Take 1 tablet (1,000 mg total) by mouth 2 (two) times daily.   No facility-administered encounter medications on file as of 10/27/2019.   Per Hospitalist: " Hospital Course / Discharge diagnoses: Acute Hypoxic  Respiratory Failure due to Covid-19 Viral Illness -patient was admitted to the hospital with hypoxic respiratory failure requiring supplemental oxygen.  Chest x-ray on admission showed multifocal pneumonia. Patient was placed on remdesivir and completed a 5-day course while hospitalized.  He was also placed on steroids.  With treatment his clinical condition improved, his hypoxia resolved and he was able to be weaned off to room air.  He is able to ambulate without hypoxia, significant shortness of breath and has returned to baseline.  He will be discharged home in stable condition with 4 additional days of steroids. Acute pulmonary embolism -while hospitalized patient had a significant spike in his D-dimer, and given persistent shortness of breath with activity he underwent a CT angiogram on 11/26 which showed pulmonary embolism.  CT scan suggested right heart strain and patient underwent a 2D echo which was fairly unremarkable (full results below).  Patient tolerated heparin well with no evidence of bleeding and stable counts, and he was placed on Eliquis.  He will need probably at least 6 months of treatment but will defer further management to his PCP. Type 2 diabetes mellitus -A1c was 8.2, patient would benefit from continued control as an outpatient Hypertension -Continue losartan Hyperlipidemia -Continue statin"  Diagnostic Tests Reviewed:  CLINICAL DATA:  COVID-19, worsening dyspnea  EXAM: PORTABLE CHEST 1 VIEW  COMPARISON:  [facility]onary CT 09/04/2019, chest radiograph 05/17/2010  FINDINGS: Multifocal areas of interstitial and airspace opacity throughout both lungs with  diminished volumes and streaky basilar areas of subsegmental atelectasis. No pneumothorax or effusion. Cardiomediastinal prominence likely accentuated by portable technique. No acute osseous or soft tissue abnormality. Degenerative changes are present in the imaged spine and shoulders.  IMPRESSION: Multifocal areas of  interstitial and airspace opacity throughout both lungs, compatible with multifocal pneumonia.  EXAM: CT ANGIOGRAPHY CHEST WITH CONTRAST  TECHNIQUE: Multidetector CT imaging of the chest was performed using the standard protocol during bolus administration of intravenous contrast. Multiplanar CT image reconstructions and MIPs were obtained to evaluate the vascular anatomy.  CONTRAST:  25mL OMNIPAQUE IOHEXOL 350 MG/ML SOLN  COMPARISON:  Chest radiograph 10/13/2019.  FINDINGS: Cardiovascular: There are tubular filling defects within the proximal LEFT lower lobe pulmonary arteries. Some defects are occlusive while others are partially occlusive (images 76 through 92 of series 4). Tubular filling defect within the RIGHT middle lobe pulmonary artery (image 81/4). Proximal filling defects within the RIGHT upper lobe pulmonary artery additionally on image 55/4.  In the LEFT lung, small partially occlusive filling defect within the LEFT upper lobe pulmonary artery. Small distal LEFT lower lobe pulmonary artery filling defect.  Overall the a clot burden is moderate to severe. There is evidence of RIGHT ventricular strain with the RIGHT ventricular diameter to LEFT ventricular diameter ratio equal 1.2. (3.8:3.3)  Mediastinum/Nodes: No axillary supraclavicular adenopathy. No mediastinal hilar adenopathy.  Lungs/Pleura: No pulmonary infarction. There is diffuse ground-glass densities in a perihilar distribution.  Upper Abdomen: Limited view of the liver, kidneys, pancreas are unremarkable. Normal adrenal glands.  Musculoskeletal: No aggressive osseous lesion.  Review of the MIP images confirms the above findings.  IMPRESSION: 1. Positive for bilateral occlusive and nonocclusive acute pulmonary thromboemboli with moderate to severe clot burden primarily on the RIGHT. 2. Positive for acute PE with CT evidence of right heart strain (RV/LV Ratio = 1.2) consistent with at  least submassive (intermediate risk) PE. The presence of right heart strain has been associated with an increased risk of morbidity and mortality. Please activate Code PE by paging [phone]. 3. Diffuse ground-glass densities in a perihilar distribution is consistent with viral pneumonia versus pulmonary edema.  Initial page to ordering physician unsuccessful. Findings conveyed toKenny RNon 10/16/2019 at09:27. Nurse instructed to inform on-call physician.    ECHOCARDIOGRAM REPORT       Patient Name:   Hunter Peters Date of Exam: 10/17/2019 Medical Rec #:  [id number]    Height:       70.0 in Accession #:    [id number]   Weight:       210.1 lb Date of Birth:  05/28/1951     BSA:          2.13 m Patient Age:    49 years     BP:           112/88 mmHg Patient Gender: M            HR:           66 bpm. Exam Location:  Inpatient  Procedure: 2D Echo  Indications:    Pulmonary embolus 415.9   History:        Patient has no prior history of Echocardiogram examinations.                 Signs/Symptoms:Chest Pain; Risk Factors:Diabetes. Covid.   Sonographer:    Vikki Ports Turrentine Referring Phys: [place]    1. Left ventricular ejection fraction, by visual estimation, is 60 to 65%. The left  ventricle has normal function. There is borderline left ventricular hypertrophy.  2. The left ventricle has no regional wall motion abnormalities.  3. Global right ventricle has normal systolic function.The right ventricular size is normal. No increase in right ventricular wall thickness.  4. Left atrial size was normal.  5. Right atrial size was normal.  6. Presence of pericardial fat pad.  7. The mitral valve is grossly normal. No evidence of mitral valve regurgitation.  8. The tricuspid valve is grossly normal. Tricuspid valve regurgitation is trivial.  9. The aortic valve is tricuspid. Aortic valve regurgitation is not visualized. No evidence of aortic valve  sclerosis or stenosis. 10. The pulmonic valve was grossly normal. Pulmonic valve regurgitation is not visualized. 11. TR signal is inadequate for assessing pulmonary artery systolic pressure. 12. The inferior vena cava is normal in size with greater than 50% respiratory variability, suggesting right atrial pressure of 3 mmHg.  FINDINGS  Left Ventricle: Left ventricular ejection fraction, by visual estimation, is 60 to 65%. The left ventricle has normal function. The left ventricle has no regional wall motion abnormalities. The left ventricular internal cavity size was the left  ventricle is normal in size. There is borderline left ventricular hypertrophy. Left ventricular diastolic parameters are consistent with age-related delayed relaxation (normal).  Right Ventricle: The right ventricular size is normal. No increase in right ventricular wall thickness. Global RV systolic function is has normal systolic function.  Left Atrium: Left atrial size was normal in size.  Right Atrium: Right atrial size was normal in size  Pericardium: There is no evidence of pericardial effusion. Presence of pericardial fat pad.  Mitral Valve: The mitral valve is grossly normal. No evidence of mitral valve regurgitation.  Tricuspid Valve: The tricuspid valve is grossly normal. Tricuspid valve regurgitation is trivial.  Aortic Valve: The aortic valve is tricuspid. Aortic valve regurgitation is not visualized. The aortic valve is structurally normal, with no evidence of sclerosis or stenosis.  Pulmonic Valve: The pulmonic valve was grossly normal. Pulmonic valve regurgitation is not visualized.  Aorta: The aortic root is normal in size and structure.  Venous: The inferior vena cava is normal in size with greater than 50% respiratory variability, suggesting right atrial pressure of 3 mmHg.  IAS/Shunts: No atrial level shunt detected by color flow Doppler.     LEFT VENTRICLE PLAX 2D LVIDd:          4.14 cm  Diastology LVIDs:         3.02 cm  LV e' lateral:   8.38 cm/s LV PW:         1.19 cm  LV E/e' lateral: 8.2 LV IVS:        1.14 cm  LV e' medial:    6.42 cm/s LVOT diam:     2.10 cm  LV E/e' medial:  10.7 LV SV:         40 ml LV SV Index:   18.40 LVOT Area:     3.46 cm    RIGHT VENTRICLE RV S prime:     10.90 cm/s TAPSE (M-mode): 1.4 cm  LEFT ATRIUM             Index       RIGHT ATRIUM           Index LA Vol (A2C):   57.7 ml 27.07 ml/m RA Area:     17.80 cm LA Vol (A4C):   44.7 ml 20.97 ml/m RA Volume:   44.40 ml  20.83 ml/m LA Biplane Vol: 51.6 ml 24.21 ml/m  AORTIC VALVE LVOT Vmax:   105.00 cm/s LVOT Vmean:  68.800 cm/s LVOT VTI:    0.186 m   AORTA Ao Root diam: 3.30 cm  MITRAL VALVE MV Area (PHT): 2.26 cm             SHUNTS MV PHT:        97.15 msec           Systemic VTI:  0.19 m MV Decel Time: 335 msec             Systemic Diam: 2.10 cm MV E velocity: 68.40 cm/s 103 cm/s MV A velocity: 91.20 cm/s 70.3 cm/s MV E/A ratio:  0.75       1.5  Disposition: Home  Consults: None  Discharge Instructions:  Follow up here within 2 weeks  Disease/illness Education: Discussed today  Home Health/Community Services Discussions/Referrals:  N/A  Establishment or re-establishment of referral orders for community resources: N/A  Discussion with other health care providers: Caren Griffins, MD via EPIC  Assessment and Support of treatment regimen adherence:  Good  Appointments Coordinated with:  Patient  Education for self-management, independent living, and ADLs:  Discussed today.  Since getting out of the hospital, Zahi has been feeling better. He continues to feel quite tired. He continues to get better day by day. His pulse ox has been in the 90s, his HR is in the high 90s-low 100s  Relevant past medical, surgical, family and social history reviewed and updated as indicated. Interim medical history since our last visit reviewed. Allergies and  medications reviewed and updated.  Review of Systems  Constitutional: Positive for fatigue. Negative for activity change, appetite change, chills, diaphoresis, fever and unexpected weight change.  HENT: Negative.   Respiratory: Negative.   Cardiovascular: Negative.   Musculoskeletal: Negative.   Neurological: Negative.   Psychiatric/Behavioral: Negative.     Per HPI unless specifically indicated above     Objective:    Pulse (!) 101   Temp (!) 96.4 F (35.8 C)   SpO2 95%   Wt Readings from Last 3 Encounters:  10/13/19 210 lb 1.6 oz (95.3 kg)  10/13/19 210 lb (95.3 kg)  08/26/19 229 lb (103.9 kg)    Physical Exam Vitals signs and nursing note reviewed.  Constitutional:      General: He is not in acute distress.    Appearance: Normal appearance. He is not ill-appearing, toxic-appearing or diaphoretic.  HENT:     Head: Normocephalic and atraumatic.     Right Ear: External ear normal.     Left Ear: External ear normal.     Nose: Nose normal.     Mouth/Throat:     Mouth: Mucous membranes are moist.     Pharynx: Oropharynx is clear.  Eyes:     General: No scleral icterus.       Right eye: No discharge.        Left eye: No discharge.     Conjunctiva/sclera: Conjunctivae normal.     Pupils: Pupils are equal, round, and reactive to light.  Neck:     Musculoskeletal: Normal range of motion.  Pulmonary:     Effort: Pulmonary effort is normal. No respiratory distress.     Comments: Speaking in full sentences Musculoskeletal: Normal range of motion.  Skin:    Coloration: Skin is not jaundiced or pale.     Findings: No bruising, erythema, lesion or rash.  Neurological:  Mental Status: He is alert and oriented to person, place, and time. Mental status is at baseline.  Psychiatric:        Mood and Affect: Mood normal.        Behavior: Behavior normal.        Thought Content: Thought content normal.        Judgment: Judgment normal.     Results for orders placed or  performed during the hospital encounter of 10/13/19  MRSA PCR Screening   Specimen: Nasal Mucosa; Nasopharyngeal  Result Value Ref Range   MRSA by PCR NEGATIVE NEGATIVE  CBC with Differential/Platelet  Result Value Ref Range   WBC 11.4 (H) 4.0 - 10.5 K/uL   RBC 4.48 4.22 - 5.81 MIL/uL   Hemoglobin 13.4 13.0 - 17.0 g/dL   HCT 40.4 39.0 - 52.0 %   MCV 90.2 80.0 - 100.0 fL   MCH 29.9 26.0 - 34.0 pg   MCHC 33.2 30.0 - 36.0 g/dL   RDW 13.9 11.5 - 15.5 %   Platelets 513 (H) 150 - 400 K/uL   nRBC 0.4 (H) 0.0 - 0.2 %   Neutrophils Relative % 93 %   Neutro Abs 10.6 (H) 1.7 - 7.7 K/uL   Lymphocytes Relative 4 %   Lymphs Abs 0.5 (L) 0.7 - 4.0 K/uL   Monocytes Relative 2 %   Monocytes Absolute 0.2 0.1 - 1.0 K/uL   Eosinophils Relative 0 %   Eosinophils Absolute 0.0 0.0 - 0.5 K/uL   Basophils Relative 0 %   Basophils Absolute 0.0 0.0 - 0.1 K/uL   Immature Granulocytes 1 %   Abs Immature Granulocytes 0.15 (H) 0.00 - 0.07 K/uL  Comprehensive metabolic panel  Result Value Ref Range   Sodium 138 135 - 145 mmol/L   Potassium 4.5 3.5 - 5.1 mmol/L   Chloride 102 98 - 111 mmol/L   CO2 25 22 - 32 mmol/L   Glucose, Bld 155 (H) 70 - 99 mg/dL   BUN 25 (H) 8 - 23 mg/dL   Creatinine, Ser 0.91 0.61 - 1.24 mg/dL   Calcium 7.8 (L) 8.9 - 10.3 mg/dL   Total Protein 6.4 (L) 6.5 - 8.1 g/dL   Albumin 2.6 (L) 3.5 - 5.0 g/dL   AST 29 15 - 41 U/L   ALT 31 0 - 44 U/L   Alkaline Phosphatase 48 38 - 126 U/L   Total Bilirubin 0.7 0.3 - 1.2 mg/dL   GFR calc non Af Amer >60 >60 mL/min   GFR calc Af Amer >60 >60 mL/min   Anion gap 11 5 - 15  C-reactive protein  Result Value Ref Range   CRP 6.9 (H) <1.0 mg/dL  D-dimer, quantitative (not at [facility])  Result Value Ref Range   D-Dimer, Quant 7.03 (H) 0.00 - 0.50 ug/mL-FEU  Hemoglobin A1c  Result Value Ref Range   Hgb A1c MFr Bld 8.2 (H) 4.8 - 5.6 %   Mean Plasma Glucose 188.64 mg/dL  Glucose, capillary  Result Value Ref Range   Glucose-Capillary 231 (H) 70 -  99 mg/dL  Glucose, capillary  Result Value Ref Range   Glucose-Capillary 247 (H) 70 - 99 mg/dL  Glucose, capillary  Result Value Ref Range   Glucose-Capillary 171 (H) 70 - 99 mg/dL   Comment 1 /   Glucose, capillary  Result Value Ref Range   Glucose-Capillary 167 (H) 70 - 99 mg/dL  Glucose, capillary  Result Value Ref Range   Glucose-Capillary 104 (H) 70 -  99 mg/dL  CBC with Differential/Platelet  Result Value Ref Range   WBC 8.5 4.0 - 10.5 K/uL   RBC 4.53 4.22 - 5.81 MIL/uL   Hemoglobin 13.4 13.0 - 17.0 g/dL   HCT 40.6 39.0 - 52.0 %   MCV 89.6 80.0 - 100.0 fL   MCH 29.6 26.0 - 34.0 pg   MCHC 33.0 30.0 - 36.0 g/dL   RDW 14.0 11.5 - 15.5 %   Platelets 456 (H) 150 - 400 K/uL   nRBC 0.0 0.0 - 0.2 %   Neutrophils Relative % 89 %   Neutro Abs 7.6 1.7 - 7.7 K/uL   Lymphocytes Relative 8 %   Lymphs Abs 0.6 (L) 0.7 - 4.0 K/uL   Monocytes Relative 2 %   Monocytes Absolute 0.1 0.1 - 1.0 K/uL   Eosinophils Relative 0 %   Eosinophils Absolute 0.0 0.0 - 0.5 K/uL   Basophils Relative 0 %   Basophils Absolute 0.0 0.0 - 0.1 K/uL   Immature Granulocytes 1 %   Abs Immature Granulocytes 0.10 (H) 0.00 - 0.07 K/uL  Comprehensive metabolic panel  Result Value Ref Range   Sodium 140 135 - 145 mmol/L   Potassium 4.2 3.5 - 5.1 mmol/L   Chloride 107 98 - 111 mmol/L   CO2 22 22 - 32 mmol/L   Glucose, Bld 77 70 - 99 mg/dL   BUN 22 8 - 23 mg/dL   Creatinine, Ser 0.72 0.61 - 1.24 mg/dL   Calcium 7.9 (L) 8.9 - 10.3 mg/dL   Total Protein 6.3 (L) 6.5 - 8.1 g/dL   Albumin 2.5 (L) 3.5 - 5.0 g/dL   AST 34 15 - 41 U/L   ALT 32 0 - 44 U/L   Alkaline Phosphatase 47 38 - 126 U/L   Total Bilirubin 0.4 0.3 - 1.2 mg/dL   GFR calc non Af Amer >60 >60 mL/min   GFR calc Af Amer >60 >60 mL/min   Anion gap 11 5 - 15  C-reactive protein  Result Value Ref Range   CRP 3.8 (H) <1.0 mg/dL  D-dimer, quantitative (not at [facility])  Result Value Ref Range   D-Dimer, Quant 13.60 (H) 0.00 - 0.50 ug/mL-FEU  Glucose,  capillary  Result Value Ref Range   Glucose-Capillary 228 (H) 70 - 99 mg/dL  Glucose, capillary  Result Value Ref Range   Glucose-Capillary 89 70 - 99 mg/dL  Glucose, capillary  Result Value Ref Range   Glucose-Capillary 181 (H) 70 - 99 mg/dL  CBC with Differential/Platelet  Result Value Ref Range   WBC 10.1 4.0 - 10.5 K/uL   RBC 4.59 4.22 - 5.81 MIL/uL   Hemoglobin 13.9 13.0 - 17.0 g/dL   HCT 41.2 39.0 - 52.0 %   MCV 89.8 80.0 - 100.0 fL   MCH 30.3 26.0 - 34.0 pg   MCHC 33.7 30.0 - 36.0 g/dL   RDW 13.9 11.5 - 15.5 %   Platelets 420 (H) 150 - 400 K/uL   nRBC 0.0 0.0 - 0.2 %   Neutrophils Relative % 91 %   Neutro Abs 9.1 (H) 1.7 - 7.7 K/uL   Lymphocytes Relative 6 %   Lymphs Abs 0.6 (L) 0.7 - 4.0 K/uL   Monocytes Relative 2 %   Monocytes Absolute 0.2 0.1 - 1.0 K/uL   Eosinophils Relative 0 %   Eosinophils Absolute 0.0 0.0 - 0.5 K/uL   Basophils Relative 0 %   Basophils Absolute 0.0 0.0 - 0.1 K/uL   Immature  Granulocytes 1 %   Abs Immature Granulocytes 0.14 (H) 0.00 - 0.07 K/uL  Comprehensive metabolic panel  Result Value Ref Range   Sodium 137 135 - 145 mmol/L   Potassium 4.3 3.5 - 5.1 mmol/L   Chloride 104 98 - 111 mmol/L   CO2 23 22 - 32 mmol/L   Glucose, Bld 79 70 - 99 mg/dL   BUN 28 (H) 8 - 23 mg/dL   Creatinine, Ser 0.88 0.61 - 1.24 mg/dL   Calcium 7.7 (L) 8.9 - 10.3 mg/dL   Total Protein 6.1 (L) 6.5 - 8.1 g/dL   Albumin 2.6 (L) 3.5 - 5.0 g/dL   AST 40 15 - 41 U/L   ALT 43 0 - 44 U/L   Alkaline Phosphatase 46 38 - 126 U/L   Total Bilirubin 1.0 0.3 - 1.2 mg/dL   GFR calc non Af Amer >60 >60 mL/min   GFR calc Af Amer >60 >60 mL/min   Anion gap 10 5 - 15  C-reactive protein  Result Value Ref Range   CRP 2.1 (H) <1.0 mg/dL  D-dimer, quantitative (not at [facility])  Result Value Ref Range   D-Dimer, Quant 13.55 (H) 0.00 - 0.50 ug/mL-FEU  Glucose, capillary  Result Value Ref Range   Glucose-Capillary 243 (H) 70 - 99 mg/dL  Glucose, capillary  Result Value Ref  Range   Glucose-Capillary 119 (H) 70 - 99 mg/dL  Heparin level (unfractionated)  Result Value Ref Range   Heparin Unfractionated 0.56 0.30 - 0.70 IU/mL  Glucose, capillary  Result Value Ref Range   Glucose-Capillary 166 (H) 70 - 99 mg/dL  Glucose, capillary  Result Value Ref Range   Glucose-Capillary 132 (H) 70 - 99 mg/dL  Glucose, capillary  Result Value Ref Range   Glucose-Capillary 213 (H) 70 - 99 mg/dL  CBC with Differential/Platelet  Result Value Ref Range   WBC 11.4 (H) 4.0 - 10.5 K/uL   RBC 4.81 4.22 - 5.81 MIL/uL   Hemoglobin 14.3 13.0 - 17.0 g/dL   HCT 42.6 39.0 - 52.0 %   MCV 88.6 80.0 - 100.0 fL   MCH 29.7 26.0 - 34.0 pg   MCHC 33.6 30.0 - 36.0 g/dL   RDW 14.1 11.5 - 15.5 %   Platelets 445 (H) 150 - 400 K/uL   nRBC 0.0 0.0 - 0.2 %   Neutrophils Relative % 91 %   Neutro Abs 10.4 (H) 1.7 - 7.7 K/uL   Lymphocytes Relative 6 %   Lymphs Abs 0.6 (L) 0.7 - 4.0 K/uL   Monocytes Relative 2 %   Monocytes Absolute 0.2 0.1 - 1.0 K/uL   Eosinophils Relative 0 %   Eosinophils Absolute 0.0 0.0 - 0.5 K/uL   Basophils Relative 0 %   Basophils Absolute 0.0 0.0 - 0.1 K/uL   Immature Granulocytes 1 %   Abs Immature Granulocytes 0.14 (H) 0.00 - 0.07 K/uL  Comprehensive metabolic panel  Result Value Ref Range   Sodium 136 135 - 145 mmol/L   Potassium 4.2 3.5 - 5.1 mmol/L   Chloride 106 98 - 111 mmol/L   CO2 22 22 - 32 mmol/L   Glucose, Bld 108 (H) 70 - 99 mg/dL   BUN 27 (H) 8 - 23 mg/dL   Creatinine, Ser 0.77 0.61 - 1.24 mg/dL   Calcium 7.9 (L) 8.9 - 10.3 mg/dL   Total Protein 6.2 (L) 6.5 - 8.1 g/dL   Albumin 2.8 (L) 3.5 - 5.0 g/dL   AST 34 15 -  41 U/L   ALT 45 (H) 0 - 44 U/L   Alkaline Phosphatase 45 38 - 126 U/L   Total Bilirubin 1.1 0.3 - 1.2 mg/dL   GFR calc non Af Amer >60 >60 mL/min   GFR calc Af Amer >60 >60 mL/min   Anion gap 8 5 - 15  C-reactive protein  Result Value Ref Range   CRP 1.1 (H) <1.0 mg/dL  D-dimer, quantitative (not at [facility])  Result Value Ref  Range   D-Dimer, Quant 13.14 (H) 0.00 - 0.50 ug/mL-FEU  Heparin level (unfractionated)  Result Value Ref Range   Heparin Unfractionated 0.65 0.30 - 0.70 IU/mL  Glucose, capillary  Result Value Ref Range   Glucose-Capillary 94 70 - 99 mg/dL  Glucose, capillary  Result Value Ref Range   Glucose-Capillary 102 (H) 70 - 99 mg/dL  Glucose, capillary  Result Value Ref Range   Glucose-Capillary 195 (H) 70 - 99 mg/dL  Glucose, capillary  Result Value Ref Range   Glucose-Capillary 230 (H) 70 - 99 mg/dL  CBC with Differential/Platelet  Result Value Ref Range   WBC 11.3 (H) 4.0 - 10.5 K/uL   RBC 4.67 4.22 - 5.81 MIL/uL   Hemoglobin 13.8 13.0 - 17.0 g/dL   HCT 41.8 39.0 - 52.0 %   MCV 89.5 80.0 - 100.0 fL   MCH 29.6 26.0 - 34.0 pg   MCHC 33.0 30.0 - 36.0 g/dL   RDW 14.2 11.5 - 15.5 %   Platelets 423 (H) 150 - 400 K/uL   nRBC 0.0 0.0 - 0.2 %   Neutrophils Relative % 92 %   Neutro Abs 10.3 (H) 1.7 - 7.7 K/uL   Lymphocytes Relative 5 %   Lymphs Abs 0.6 (L) 0.7 - 4.0 K/uL   Monocytes Relative 2 %   Monocytes Absolute 0.2 0.1 - 1.0 K/uL   Eosinophils Relative 0 %   Eosinophils Absolute 0.0 0.0 - 0.5 K/uL   Basophils Relative 0 %   Basophils Absolute 0.0 0.0 - 0.1 K/uL   Immature Granulocytes 1 %   Abs Immature Granulocytes 0.16 (H) 0.00 - 0.07 K/uL  Comprehensive metabolic panel  Result Value Ref Range   Sodium 135 135 - 145 mmol/L   Potassium 4.2 3.5 - 5.1 mmol/L   Chloride 105 98 - 111 mmol/L   CO2 21 (L) 22 - 32 mmol/L   Glucose, Bld 120 (H) 70 - 99 mg/dL   BUN 30 (H) 8 - 23 mg/dL   Creatinine, Ser 0.92 0.61 - 1.24 mg/dL   Calcium 8.1 (L) 8.9 - 10.3 mg/dL   Total Protein 6.2 (L) 6.5 - 8.1 g/dL   Albumin 2.8 (L) 3.5 - 5.0 g/dL   AST 44 (H) 15 - 41 U/L   ALT 68 (H) 0 - 44 U/L   Alkaline Phosphatase 47 38 - 126 U/L   Total Bilirubin 0.8 0.3 - 1.2 mg/dL   GFR calc non Af Amer >60 >60 mL/min   GFR calc Af Amer >60 >60 mL/min   Anion gap 9 5 - 15  C-reactive protein  Result  Value Ref Range   CRP 0.8 <1.0 mg/dL  D-dimer, quantitative (not at [facility])  Result Value Ref Range   D-Dimer, Quant 11.13 (H) 0.00 - 0.50 ug/mL-FEU  Heparin level (unfractionated)  Result Value Ref Range   Heparin Unfractionated 2.01 (H) 0.30 - 0.70 IU/mL  Glucose, capillary  Result Value Ref Range   Glucose-Capillary 195 (H) 70 - 99 mg/dL  Glucose, capillary  Result Value Ref Range   Glucose-Capillary 124 (H) 70 - 99 mg/dL  ECHOCARDIOGRAM COMPLETE  Result Value Ref Range   Weight 3,361.57 oz   Height 70 in   BP 107/69 mmHg  ABO/Rh  Result Value Ref Range   ABO/RH(D)      A NEG Performed at [facility] [address], [place], [place] [address]       Assessment & Plan:   Problem List Items Addressed This Visit      Cardiovascular and Mediastinum   Acute pulmonary embolism ([place])    Likely due to COVID. Will check hypercoagulable work up at next visit. He would like to repeat d-dimer. Currently on eliquis- quite expensive. Xarelto less expensive, but he doesn't really want to change. Rx sent to his pharmacy- we will await PA. Continue to monitor. Likely will anticoagulate for 6 months. Call with any concerns.       Relevant Medications   apixaban (ELIQUIS) 5 MG TABS tablet     Respiratory   Pneumonia due to COVID-19 virus - Primary    No fever. No more SOB, still significantly fatigued- slowly getting better. Continue to monitor. Call with any concerns. Will repeat CXR in 6 weeks.         Endocrine   Diabetes mellitus type 2, controlled, without complications (HCC)    Sugars elevated in the hospital- likely from the prednisone. Improved now fasting 140s. Will recheck A1c in 3 months- call if sugars going above 150 consistently.           Follow up plan: Return in about 6 weeks (around 12/08/2019).   . This visit was completed via [employer] due to the restrictions of the COVID-19 pandemic. All issues as above were discussed and addressed.  Physical exam was done as above through visual confirmation on Doximity. If it was felt that the patient should be evaluated in the office, they were directed there. The patient verbally consented to this visit. . Location of the patient: home . Location of the provider: work . Those involved with this call:  . Provider: Park Liter, DO . CMA: Tiffany Reel, CMA . Front Desk/Registration: Don Perking  . Time spent on call: 25 minutes with patient face to face via video conference. More than 50% of this time was spent in counseling and coordination of care. 40 minutes total spent in review of patient's record and preparation of their chart.

## 2019-10-27 NOTE — Assessment & Plan Note (Signed)
No fever. No more SOB, still significantly fatigued- slowly getting better. Continue to monitor. Call with any concerns. Will repeat CXR in 6 weeks.

## 2019-10-27 NOTE — Assessment & Plan Note (Signed)
Sugars elevated in the hospital- likely from the prednisone. Improved now fasting 140s. Will recheck A1c in 3 months- call if sugars going above 150 consistently.

## 2019-10-28 ENCOUNTER — Telehealth: Payer: Self-pay

## 2019-10-28 ENCOUNTER — Other Ambulatory Visit: Payer: Self-pay | Admitting: Family Medicine

## 2019-10-28 MED ORDER — APIXABAN 5 MG PO TABS: 5.0000 mg | ORAL_TABLET | Freq: Two times a day (BID) | ORAL | 4 refills | Status: DC

## 2019-10-28 NOTE — Telephone Encounter (Signed)
PA for Eliquis initiated and submitted via Cover My Meds. Key: [id number]

## 2019-10-29 NOTE — Telephone Encounter (Signed)
PA approved.

## 2019-11-03 ENCOUNTER — Ambulatory Visit: Payer: Self-pay | Admitting: *Deleted

## 2019-11-03 NOTE — Telephone Encounter (Signed)
Pt called with complaints of having problems using the bathroom; he had a anal fistula and anal abscess (pt was living in [facility]); there is a harden area is at the top of his anus, and there is no discharge; the has taken Tylenol, and taken a hot bath which helped; his pain was 7 out of 10 on 11/02/2019, and 5 out of 10 today; the pt says he is unable to sit due to discomfort; the area is about an inch long, and 1/2 the size of a quarter; he was recently hospitalized due to [place]; Tylenol has also helped with the pain; recommendations made per nurse triage protocol; the pt sees Dr Park Liter, [facility]; pt transferred to [facility] for scheduling.  Reason for Disposition . MODERATE-SEVERE rectal pain (i.e., interferes with school, work, or sleep)  Answer Assessment - Initial Assessment Questions 1. SYMPTOM:  "What's the main symptom you're concerned about?" (e.g., pain, itching, swelling, rash)     Hardened area above retum 2. ONSET: "When did the  start?"     11/02/2019 3. RECTAL PAIN: "Do you have any pain around your rectum?" "How bad is the pain?"  (Scale 1-10; or mild, moderate, severe)  - MILD (1-3): doesn't interfere with normal activities   - MODERATE (4-7): interferes with normal activities or awakens from sleep, limping   - SEVERE (8-10): excruciating pain, unable to have a bowel movement      5-7 out of 10 4. RECTAL ITCHING: "Do you have any itching in this area?" "How bad is the itching?"  (Scale 1-10; or mild, moderate, severe)  - MILD - doesn't interfere with normal activities   - MODERATE-SEVERE: interferes with normal activities or awakens from sleep   Yes moderate 5. CONSTIPATION: "Do you have constipation?" If so, "How bad is it?"     no 6. CAUSE: "What do you think is causing the anus symptoms?"     History of anal fistula, and abscess 7. OTHER SYMPTOMS: "Do you have any other symptoms?"  (e.g., rectal bleeding, abdominal pain, vomiting, fever)     Gas pains, shooting  abdominal pain which stopped 12 hrs ago 8. PREGNANCY: "Is there any chance you are pregnant?" "When was your last menstrual period?"     no  Protocols used: RECTAL [facility]

## 2019-11-04 ENCOUNTER — Encounter: Payer: Self-pay | Admitting: Unknown Physician Specialty

## 2019-11-04 ENCOUNTER — Other Ambulatory Visit: Payer: Self-pay

## 2019-11-04 ENCOUNTER — Ambulatory Visit (INDEPENDENT_AMBULATORY_CARE_PROVIDER_SITE_OTHER): Payer: Medicare Other | Admitting: Unknown Physician Specialty

## 2019-11-04 VITALS — BP 106/70 | HR 95 | Temp 98.0°F | Ht 70.0 in | Wt 203.0 lb

## 2019-11-04 DIAGNOSIS — K602 Anal fissure, unspecified: Secondary | ICD-10-CM | POA: Diagnosis not present

## 2019-11-04 MED ORDER — HYDROCODONE-ACETAMINOPHEN 5-325 MG PO TABS: 1.0000 | ORAL_TABLET | Freq: Four times a day (QID) | ORAL | 0 refills | Status: DC | PRN

## 2019-11-04 MED ORDER — LIDOCAINE-PRILOCAINE 2.5-2.5 % EX CREA: 1.0000 "application " | TOPICAL_CREAM | CUTANEOUS | 0 refills | Status: DC | PRN

## 2019-11-04 NOTE — Progress Notes (Signed)
BP 106/70 (BP Location: Left Arm, Patient Position: Sitting, Cuff Size: Normal)   Pulse 95   Temp 98 F (36.7 C) (Oral)   Ht 5\' 10"  (1.778 m)   Wt 203 lb (92.1 kg)   SpO2 98%   BMI 29.13 kg/m    Subjective:    Patient ID: Hunter Peters, male    DOB: 1951-01-03, 68 y.o.   MRN: [id number]  HPI: Hunter Peters is a 68 y.o. male  Chief Complaint  Patient presents with  . Rectal Pain    Severe rectal pain ongoing 2 days. Denies rectal bleeding. Hx of Anal Fissure and Anal Abscess.    Pt with severe rectal pain for 2 days history of rectal pain.  History of rectal fissures.  Used to take metamucil but has not in a while.  New rx for Eluquis since hospitalized for Covid secondary to "blood clots in my lung.  Due to redo x-rays in January.    Relevant past medical, surgical, family and social history reviewed and updated as indicated. Interim medical history since our last visit reviewed. Allergies and medications reviewed and updated.  Review of Systems  Constitutional: Positive for fatigue.  HENT: Negative.   Eyes: Negative.   Respiratory: Positive for shortness of breath.   Cardiovascular: Negative.   Skin: Negative.     Per HPI unless specifically indicated above     Objective:    BP 106/70 (BP Location: Left Arm, Patient Position: Sitting, Cuff Size: Normal)   Pulse 95   Temp 98 F (36.7 C) (Oral)   Ht 5\' 10"  (1.778 m)   Wt 203 lb (92.1 kg)   SpO2 98%   BMI 29.13 kg/m   Wt Readings from Last 3 Encounters:  11/04/19 203 lb (92.1 kg)  10/13/19 210 lb 1.6 oz (95.3 kg)  10/13/19 210 lb (95.3 kg)    Physical Exam Constitutional:      General: He is not in acute distress.    Appearance: Normal appearance. He is well-developed.  HENT:     Head: Normocephalic and atraumatic.  Eyes:     General: Lids are normal. No scleral icterus.       Right eye: No discharge.        Left eye: No discharge.     Conjunctiva/sclera: Conjunctivae normal.  Neck:     Vascular: No  carotid bruit or JVD.  Cardiovascular:     Rate and Rhythm: Normal rate and regular rhythm.     Heart sounds: Normal heart sounds.  Pulmonary:     Effort: Pulmonary effort is normal. No respiratory distress.     Breath sounds: Normal breath sounds.  Abdominal:     Palpations: There is no hepatomegaly or splenomegaly.  Genitourinary:    Comments: Large 12 o clock anal fissure Musculoskeletal:        General: Normal range of motion.     Cervical back: Normal range of motion and neck supple.  Skin:    General: Skin is warm and dry.     Coloration: Skin is not pale.     Findings: No rash.  Neurological:     Mental Status: He is alert and oriented to person, place, and time.  Psychiatric:        Behavior: Behavior normal.        Thought Content: Thought content normal.        Judgment: Judgment normal.      Assessment & Plan:   Problem List  Items Addressed This Visit    None    Visit Diagnoses    Rectal fissure    -  Primary   Will refer to surgery ASAP.  Recommend sitz baths, miralax and mineral oil   Relevant Orders   Ambulatory referral to General Surgery      Short term rx for Hydrocodone to help with pain.  Lidocaine cream to pharmacy for local treatment   Follow up plan: Follow up with surgery

## 2019-11-05 ENCOUNTER — Telehealth: Payer: Self-pay | Admitting: Surgery

## 2019-11-05 ENCOUNTER — Ambulatory Visit (INDEPENDENT_AMBULATORY_CARE_PROVIDER_SITE_OTHER): Payer: Medicare Other | Admitting: Surgery

## 2019-11-05 ENCOUNTER — Encounter: Payer: Self-pay | Admitting: Surgery

## 2019-11-05 DIAGNOSIS — K611 Rectal abscess: Secondary | ICD-10-CM | POA: Diagnosis not present

## 2019-11-05 MED ORDER — SODIUM CHLORIDE 0.9 % IV SOLN
2.0000 g | INTRAVENOUS | Status: DC
  Filled 2019-11-05: qty 2

## 2019-11-05 MED ORDER — AMOXICILLIN-POT CLAVULANATE 875-125 MG PO TABS: 1.0000 | ORAL_TABLET | Freq: Two times a day (BID) | ORAL | 0 refills | Status: AC

## 2019-11-05 MED ORDER — GABAPENTIN 300 MG PO CAPS: 300.0000 mg | ORAL_CAPSULE | Freq: Three times a day (TID) | ORAL | 0 refills | Status: DC

## 2019-11-05 NOTE — Telephone Encounter (Signed)
Pt has been advised of pre admission date/time, Covid Testing date and Surgery date.  Surgery Date: 11/06/19 with Dr Dicie Beam and I&D of perianal abscess.  Preadmission Testing Date: Patient will arrive 2 hours early prior to surgery.  Covid Testing Date: No covid test needed due to patient having a positive result in the past 30 days.  Emmi Video sent via [facility] Video and Covid Education Video.  Patient has been made aware to call [phone], between 1-3:00pm the day before surgery, to find out what time to arrive.

## 2019-11-05 NOTE — H&P (View-Only) (Signed)
Surgical Consultation  11/05/2019  Hunter Peters is an 68 y.o. male.   Chief Complaint  Patient presents with  . New Patient (Initial Visit)    new pt ref [name] rectal fissure     HPI: Hunter Peters is a 68 year old male with prior history of anal problems.  Apparently he states that he had fistula that was repaired and apparently a plug was placed.  This was done few years ago in an outside hospital.  I do not have any records.  Over the last few days he reports moderate to severe anorectal pain more located on the right side and posterior aspect.  No fevers no chills.  The pain is worsening when he sits down and he applies pressure there.  No specific alleviating factors.  No drainage.  He was recently hospitalized for Covid 19 was given steroids as well as remdesivir.  He has recovered well and has no active pulmonary symptoms.  He feels a little bit tired and says he is up to 80% back to his normal.  He does have significant history of diabetes and has recently been started on Eliquis for a PE that was found incidentally.  Past Medical History:  Diagnosis Date  . Diabetes mellitus without complication ([place])   . Hypercholesterolemia   . Type 2 diabetes mellitus with hyperlipidemia [facility])     Past Surgical History:  Procedure Laterality Date  . EYE SURGERY  5 years ago   lens  . Eyelid Surgery Bilateral   . INTRAOCULAR LENS INSERTION Left 7/11  . KNEE ARTHROSCOPY Right     Family History  Problem Relation Age of Onset  . Alzheimer's disease Mother   . Hypertension Father     Social History:  reports that he has quit smoking. His smoking use included cigars. He smoked 0.00 packs per day for 0.00 years. He has never used smokeless tobacco. He reports current alcohol use. He reports that he does not use drugs.  Allergies:  Allergies  Allergen Reactions  . Sulfonamide Derivatives     GI Intolerance    Medications reviewed.     ROS Full ROS performed and is otherwise  negative other than what is stated in the HPI    There were no vitals taken for this visit.  Physical Exam Vitals and nursing note reviewed. Exam conducted with a chaperone present.  Constitutional:      General: He is not in acute distress.    Appearance: Normal appearance. He is normal weight.  Eyes:     General: No scleral icterus.       Right eye: No discharge.        Left eye: No discharge.  Cardiovascular:     Rate and Rhythm: Normal rate and regular rhythm.     Heart sounds: No murmur.  Pulmonary:     Effort: Pulmonary effort is normal. No respiratory distress.     Breath sounds: Normal breath sounds. No stridor.  Abdominal:     General: Abdomen is flat. There is no distension.     Palpations: Abdomen is soft. There is no mass.     Tenderness: There is no abdominal tenderness. There is no rebound.     Hernia: No hernia is present.  Genitourinary:    Comments: There is evidence of induration and fluctuance from the right posterolateral aspect all the way to the posterior midline.  He does have a few scars from previous I&D's/fistulotomy's.  There is no evidence of necrotizing  infection at this time.  Exam is limited due to significant tenderness. Musculoskeletal:        General: No swelling. Normal range of motion.     Cervical back: Normal range of motion and neck supple.  Skin:    General: Skin is warm and dry.     Capillary Refill: Capillary refill takes less than 2 seconds.  Neurological:     General: No focal deficit present.     Mental Status: He is alert and oriented to person, place, and time.  Psychiatric:        Mood and Affect: Mood normal.        Behavior: Behavior normal.        Thought Content: Thought content normal.        Judgment: Judgment normal.       Assessment/Plan: 68 year old male with signs and symptoms consistent with perianal/perirectal abscess on the posterior aspect.  Patient is in need for exam under anesthesia and formal I&D.   Procedure discussed with the patient in detail.  Risk, benefits and possible complications including but not limited to: Leading, infection, injury to adjacent structures.  He is currently taking Eliquis which will make you more susceptible to bleeding .  He understands there is there is really no other good alternative we will hold a couple doses of the Eliquis until tomorrow.  Extensive counseling provided  Caroleen Hamman, MD [facility] General Surgeon

## 2019-11-05 NOTE — Patient Instructions (Addendum)
Patient was prescribed Augmentin and Gabapentin at today's visit. [facility] will contact patient when his prescriptions are ready for pick up. Patient may try cold compress and warm Sitz Bath to help with the pain and discomfort.   Anal Fissure, Adult  An anal fissure is a small tear or crack in the tissue of the anus. Bleeding from a fissure usually stops on its own within a few minutes. However, bleeding will often occur again with each bowel movement until the fissure heals. What are the causes? This condition is usually caused by passing a large or hard stool (feces). Other causes include:  Constipation.  Frequent diarrhea.  Inflammatory bowel disease (Crohn's disease or ulcerative colitis).  Childbirth.  Infections.  Anal sex. What are the signs or symptoms? Symptoms of this condition include:  Bleeding from the rectum.  Small amounts of blood seen on your stool, on the toilet paper, or in the toilet after a bowel movement. The blood coats the outside of the stool and is not mixed with the stool.  Painful bowel movements.  Itching or irritation around the anus. How is this diagnosed? A health care provider may diagnose this condition by closely examining the anal area. An anal fissure can usually be seen with careful inspection. In some cases, a rectal exam may be performed, or a short tube (anoscope) may be used to examine the anal canal. How is this treated? Initial treatment for this condition may include:  Taking steps to avoid constipation. This may include making changes to your diet, such as increasing your intake of fiber or fluid.  Taking fiber supplements. These supplements can soften your stool to help make bowel movements easier. Your health care provider may also prescribe a stool softener if your stool is hard.  Taking sitz baths. This may help to heal the tear.  Using medicated creams or ointments. These may be prescribed to lessen  discomfort. Treatments that are sometimes used if initial treatments do not work well or if the condition is more severe may include:  Botulinum injection.  Surgery to repair the fissure. Follow these instructions at home: Eating and drinking   Avoid foods that may cause constipation, such as bananas, milk, and other dairy products.  Eat all fruits, except bananas.  Drink enough fluid to keep your urine pale yellow.  Eat foods that are high in fiber, such as beans, whole grains, and fresh fruits and vegetables. General instructions   Take over-the-counter and prescription medicines only as told by your health care provider.  Use creams or ointments only as told by your health care provider.  Keep the anal area clean and dry.  Take sitz baths as told by your health care provider. Do not use soap in the sitz baths.  Keep all follow-up visits as told by your health care provider. This is important. Contact a health care provider if you have:  More bleeding.  A fever.  Diarrhea that is mixed with blood.  Pain that continues.  Ongoing problems that are getting worse rather than better. Summary  An anal fissure is a small tear or crack in the tissue of the anus. This condition is usually caused by passing a large or hard stool (feces). Other causes include constipation and frequent diarrhea.  Initial treatment for this condition may include taking steps to avoid constipation, such as increasing your intake of fiber or fluid.  Follow instructions for care as told by your health care provider.  Contact your  health care provider if you have more bleeding or your problem is getting worse rather than better.  Keep all follow-up visits as told by your health care provider. This is important. This information is not intended to replace advice given to you by your health care provider. Make sure you discuss any questions you have with your health care provider. Document  Released: 11/06/2005 Document Revised: 04/18/2018 Document Reviewed: 04/18/2018 Elsevier Patient Education  2020 [employer].

## 2019-11-05 NOTE — Progress Notes (Signed)
Surgical Consultation  11/05/2019  Hunter Peters is an 68 y.o. male.   Chief Complaint  Patient presents with  . New Patient (Initial Visit)    new pt ref Hunter Peters rectal fissure     HPI: Hunter Peters is a 68 year old male with prior history of anal problems.  Apparently he states that he had fistula that was repaired and apparently a plug was placed.  This was done few years ago in an outside hospital.  I do not have any records.  Over the last few days he reports moderate to severe anorectal pain more located on the right side and posterior aspect.  No fevers no chills.  The pain is worsening when he sits down and he applies pressure there.  No specific alleviating factors.  No drainage.  He was recently hospitalized for Covid 19 was given steroids as well as remdesivir.  He has recovered well and has no active pulmonary symptoms.  He feels a little bit tired and says he is up to 80% back to his normal.  He does have significant history of diabetes and has recently been started on Eliquis for a PE that was found incidentally.  Past Medical History:  Diagnosis Date  . Diabetes mellitus without complication (Hunter Peters)   . Hypercholesterolemia   . Type 2 diabetes mellitus with hyperlipidemia [facility])     Past Surgical History:  Procedure Laterality Date  . EYE SURGERY  5 years ago   lens  . Eyelid Surgery Bilateral   . INTRAOCULAR LENS INSERTION Left 7/11  . KNEE ARTHROSCOPY Right     Family History  Problem Relation Age of Onset  . Alzheimer's disease Mother   . Hypertension Father     Social History:  reports that he has quit smoking. His smoking use included cigars. He smoked 0.00 packs per day for 0.00 years. He has never used smokeless tobacco. He reports current alcohol use. He reports that he does not use drugs.  Allergies:  Allergies  Allergen Reactions  . Sulfonamide Derivatives     GI Intolerance    Medications reviewed.     ROS Full ROS performed and is otherwise  negative other than what is stated in the HPI    There were no vitals taken for this visit.  Physical Exam Vitals and nursing note reviewed. Exam conducted with a chaperone present.  Constitutional:      General: He is not in acute distress.    Appearance: Normal appearance. He is normal weight.  Eyes:     General: No scleral icterus.       Right eye: No discharge.        Left eye: No discharge.  Cardiovascular:     Rate and Rhythm: Normal rate and regular rhythm.     Heart sounds: No murmur.  Pulmonary:     Effort: Pulmonary effort is normal. No respiratory distress.     Breath sounds: Normal breath sounds. No stridor.  Abdominal:     General: Abdomen is flat. There is no distension.     Palpations: Abdomen is soft. There is no mass.     Tenderness: There is no abdominal tenderness. There is no rebound.     Hernia: No hernia is present.  Genitourinary:    Comments: There is evidence of induration and fluctuance from the right posterolateral aspect all the way to the posterior midline.  He does have a few scars from previous I&D's/fistulotomy's.  There is no evidence of necrotizing  infection at this time.  Exam is limited due to significant tenderness. Musculoskeletal:        General: No swelling. Normal range of motion.     Cervical back: Normal range of motion and neck supple.  Skin:    General: Skin is warm and dry.     Capillary Refill: Capillary refill takes less than 2 seconds.  Neurological:     General: No focal deficit present.     Mental Status: He is alert and oriented to person, place, and time.  Psychiatric:        Mood and Affect: Mood normal.        Behavior: Behavior normal.        Thought Content: Thought content normal.        Judgment: Judgment normal.       Assessment/Plan: 68 year old male with signs and symptoms consistent with perianal/perirectal abscess on the posterior aspect.  Patient is in need for exam under anesthesia and formal I&D.   Procedure discussed with the patient in detail.  Risk, benefits and possible complications including but not limited to: Leading, infection, injury to adjacent structures.  He is currently taking Eliquis which will make you more susceptible to bleeding .  He understands there is there is really no other good alternative we will hold a couple doses of the Eliquis until tomorrow.  Extensive counseling provided  Caroleen Hamman, MD [facility] General Surgeon

## 2019-11-06 ENCOUNTER — Ambulatory Visit
Admission: RE | Admit: 2019-11-06 | Discharge: 2019-11-06 | Disposition: A | Discharge status: Home / Self Care | Payer: Medicare Other | Source: Ambulatory Visit | Attending: Surgery | Admitting: Surgery

## 2019-11-06 ENCOUNTER — Encounter: Payer: Self-pay | Admitting: Family Medicine

## 2019-11-06 ENCOUNTER — Ambulatory Visit: Payer: Medicare Other | Admitting: Anesthesiology

## 2019-11-06 ENCOUNTER — Encounter: Payer: Self-pay | Admitting: Surgery

## 2019-11-06 ENCOUNTER — Other Ambulatory Visit: Payer: Self-pay

## 2019-11-06 ENCOUNTER — Encounter
Admission: RE | Disposition: A | Discharge status: Home / Self Care | Payer: Self-pay | Source: Ambulatory Visit | Attending: Surgery

## 2019-11-06 DIAGNOSIS — J1282 Pneumonia due to coronavirus disease 2019: Secondary | ICD-10-CM

## 2019-11-06 DIAGNOSIS — Z882 Allergy status to sulfonamides status: Secondary | ICD-10-CM | POA: Diagnosis not present

## 2019-11-06 DIAGNOSIS — Z79899 Other long term (current) drug therapy: Secondary | ICD-10-CM | POA: Insufficient documentation

## 2019-11-06 DIAGNOSIS — E119 Type 2 diabetes mellitus without complications: Secondary | ICD-10-CM | POA: Diagnosis not present

## 2019-11-06 DIAGNOSIS — Z86711 Personal history of pulmonary embolism: Secondary | ICD-10-CM | POA: Insufficient documentation

## 2019-11-06 DIAGNOSIS — Z87891 Personal history of nicotine dependence: Secondary | ICD-10-CM | POA: Insufficient documentation

## 2019-11-06 DIAGNOSIS — Z8619 Personal history of other infectious and parasitic diseases: Secondary | ICD-10-CM | POA: Insufficient documentation

## 2019-11-06 DIAGNOSIS — Z881 Allergy status to other antibiotic agents status: Secondary | ICD-10-CM | POA: Insufficient documentation

## 2019-11-06 DIAGNOSIS — Z7984 Long term (current) use of oral hypoglycemic drugs: Secondary | ICD-10-CM | POA: Insufficient documentation

## 2019-11-06 DIAGNOSIS — K611 Rectal abscess: Secondary | ICD-10-CM | POA: Insufficient documentation

## 2019-11-06 DIAGNOSIS — Z7901 Long term (current) use of anticoagulants: Secondary | ICD-10-CM | POA: Insufficient documentation

## 2019-11-06 DIAGNOSIS — I2699 Other pulmonary embolism without acute cor pulmonale: Secondary | ICD-10-CM

## 2019-11-06 DIAGNOSIS — U071 COVID-19: Secondary | ICD-10-CM

## 2019-11-06 DIAGNOSIS — E7800 Pure hypercholesterolemia, unspecified: Secondary | ICD-10-CM | POA: Diagnosis not present

## 2019-11-06 HISTORY — PX: INCISION AND DRAINAGE ABSCESS: SHX5864

## 2019-11-06 HISTORY — PX: RECTAL EXAM UNDER ANESTHESIA: SHX6399

## 2019-11-06 LAB — GLUCOSE, CAPILLARY
Glucose-Capillary: 148 mg/dL — ABNORMAL HIGH (ref 70–99)
Glucose-Capillary: 131 mg/dL — ABNORMAL HIGH (ref 70–99)

## 2019-11-06 SURGERY — EXAM UNDER ANESTHESIA, RECTUM
Anesthesia: General

## 2019-11-06 MED ORDER — FAMOTIDINE 20 MG PO TABS: 20.0000 mg | ORAL_TABLET | Freq: Once | ORAL | Status: AC

## 2019-11-06 MED ORDER — BUPIVACAINE LIPOSOME 1.3 % IJ SUSP
INTRAMUSCULAR | Status: DC | PRN
  Administered 2019-11-06: 20 mL

## 2019-11-06 MED ORDER — HYDROCODONE-ACETAMINOPHEN 5-325 MG PO TABS: 1.0000 | ORAL_TABLET | Freq: Four times a day (QID) | ORAL | 0 refills | Status: DC | PRN

## 2019-11-06 MED ORDER — ROCURONIUM BROMIDE 50 MG/5ML IV SOLN
INTRAVENOUS | Status: AC
  Filled 2019-11-06: qty 1

## 2019-11-06 MED ORDER — MIDAZOLAM HCL 2 MG/2ML IJ SOLN
INTRAMUSCULAR | Status: DC | PRN
  Administered 2019-11-06: 2 mg via INTRAVENOUS

## 2019-11-06 MED ORDER — ONDANSETRON HCL 4 MG/2ML IJ SOLN
INTRAMUSCULAR | Status: DC | PRN
  Administered 2019-11-06: 4 mg via INTRAVENOUS

## 2019-11-06 MED ORDER — FENTANYL CITRATE (PF) 100 MCG/2ML IJ SOLN
INTRAMUSCULAR | Status: DC | PRN
  Administered 2019-11-06 (×2): 50 ug via INTRAVENOUS

## 2019-11-06 MED ORDER — MIDAZOLAM HCL 2 MG/2ML IJ SOLN
INTRAMUSCULAR | Status: AC
  Filled 2019-11-06: qty 2

## 2019-11-06 MED ORDER — BUPIVACAINE HCL (PF) 0.25 % IJ SOLN
INTRAMUSCULAR | Status: AC
  Filled 2019-11-06: qty 30

## 2019-11-06 MED ORDER — PROPOFOL 500 MG/50ML IV EMUL
INTRAVENOUS | Status: AC
  Filled 2019-11-06: qty 50

## 2019-11-06 MED ORDER — ACETAMINOPHEN 500 MG PO TABS
ORAL_TABLET | ORAL | Status: AC
  Administered 2019-11-06: 1000 mg via ORAL
  Filled 2019-11-06: qty 2

## 2019-11-06 MED ORDER — BUPIVACAINE HCL (PF) 0.25 % IJ SOLN
INTRAMUSCULAR | Status: DC | PRN
  Administered 2019-11-06: 20 mL

## 2019-11-06 MED ORDER — EPINEPHRINE PF 1 MG/ML IJ SOLN
INTRAMUSCULAR | Status: AC
  Filled 2019-11-06: qty 1

## 2019-11-06 MED ORDER — PROPOFOL 10 MG/ML IV BOLUS
INTRAVENOUS | Status: DC | PRN
  Administered 2019-11-06: 200 mg via INTRAVENOUS

## 2019-11-06 MED ORDER — OXYCODONE HCL 5 MG/5ML PO SOLN: 5.0000 mg | Freq: Once | ORAL | Status: DC | PRN

## 2019-11-06 MED ORDER — GABAPENTIN 300 MG PO CAPS: 300.0000 mg | ORAL_CAPSULE | ORAL | Status: AC

## 2019-11-06 MED ORDER — SUCCINYLCHOLINE CHLORIDE 20 MG/ML IJ SOLN
INTRAMUSCULAR | Status: AC
  Filled 2019-11-06: qty 1

## 2019-11-06 MED ORDER — SODIUM CHLORIDE 0.9 % IV SOLN
INTRAVENOUS | Status: DC | PRN
  Administered 2019-11-06: 09:00:00 2 g via INTRAVENOUS

## 2019-11-06 MED ORDER — ACETAMINOPHEN 500 MG PO TABS: 1000.0000 mg | ORAL_TABLET | ORAL | Status: AC

## 2019-11-06 MED ORDER — OXYCODONE HCL 5 MG PO TABS: 5.0000 mg | ORAL_TABLET | Freq: Once | ORAL | Status: DC | PRN

## 2019-11-06 MED ORDER — CHLORHEXIDINE GLUCONATE CLOTH 2 % EX PADS: 6.0000 | MEDICATED_PAD | Freq: Once | CUTANEOUS | Status: DC

## 2019-11-06 MED ORDER — SODIUM CHLORIDE 0.9 % IV SOLN: INTRAVENOUS | Status: DC

## 2019-11-06 MED ORDER — FAMOTIDINE 20 MG PO TABS
ORAL_TABLET | ORAL | Status: AC
  Administered 2019-11-06: 20 mg via ORAL
  Filled 2019-11-06: qty 1

## 2019-11-06 MED ORDER — DEXAMETHASONE SODIUM PHOSPHATE 10 MG/ML IJ SOLN
INTRAMUSCULAR | Status: DC | PRN
  Administered 2019-11-06: 10 mg via INTRAVENOUS

## 2019-11-06 MED ORDER — LACTATED RINGERS IV SOLN: INTRAVENOUS | Status: DC | PRN

## 2019-11-06 MED ORDER — LIDOCAINE HCL (CARDIAC) PF 100 MG/5ML IV SOSY
PREFILLED_SYRINGE | INTRAVENOUS | Status: DC | PRN
  Administered 2019-11-06: 100 mg via INTRAVENOUS

## 2019-11-06 MED ORDER — BUPIVACAINE LIPOSOME 1.3 % IJ SUSP
INTRAMUSCULAR | Status: AC
  Filled 2019-11-06: qty 20

## 2019-11-06 MED ORDER — FENTANYL CITRATE (PF) 100 MCG/2ML IJ SOLN: 25.0000 ug | INTRAMUSCULAR | Status: DC | PRN

## 2019-11-06 MED ORDER — PHENYLEPHRINE HCL (PRESSORS) 10 MG/ML IV SOLN
INTRAVENOUS | Status: DC | PRN
  Administered 2019-11-06: 80 ug via INTRAVENOUS

## 2019-11-06 MED ORDER — FENTANYL CITRATE (PF) 100 MCG/2ML IJ SOLN
INTRAMUSCULAR | Status: AC
  Filled 2019-11-06: qty 2

## 2019-11-06 MED ORDER — GABAPENTIN 300 MG PO CAPS
ORAL_CAPSULE | ORAL | Status: AC
  Administered 2019-11-06: 300 mg via ORAL
  Filled 2019-11-06: qty 1

## 2019-11-06 SURGICAL SUPPLY — 20 items
BLADE CLIPPER SURG (BLADE) ×3 IMPLANT
BLADE SURG 15 STRL LF DISP TIS (BLADE) ×1 IMPLANT
BLADE SURG 15 STRL SS (BLADE) ×2
BRUSH SCRUB EZ  4% CHG (MISCELLANEOUS) ×2
BRUSH SCRUB EZ 4% CHG (MISCELLANEOUS) ×1 IMPLANT
CANISTER SUCT 3000ML PPV (MISCELLANEOUS) ×3 IMPLANT
COVER WAND RF STERILE (DRAPES) ×3 IMPLANT
DRAPE LAPAROTOMY 77X122 PED (DRAPES) ×3 IMPLANT
ELECT REM PT RETURN 9FT ADLT (ELECTROSURGICAL) ×3
ELECTRODE REM PT RTRN 9FT ADLT (ELECTROSURGICAL) ×1 IMPLANT
GAUZE PACKING 1/4 X5 YD (GAUZE/BANDAGES/DRESSINGS) ×4 IMPLANT
GLOVE BIO SURGEON STRL SZ7 (GLOVE) ×3 IMPLANT
GOWN STRL REUS W/ TWL LRG LVL3 (GOWN DISPOSABLE) ×2 IMPLANT
GOWN STRL REUS W/TWL LRG LVL3 (GOWN DISPOSABLE) ×4
NEEDLE HYPO 22GX1.5 SAFETY (NEEDLE) ×3 IMPLANT
NS IRRIG 1000ML POUR BTL (IV SOLUTION) ×3 IMPLANT
PACK BASIN MINOR ARMC (MISCELLANEOUS) ×3 IMPLANT
SOL PREP PVP 2OZ (MISCELLANEOUS) ×3
SOLUTION PREP PVP 2OZ (MISCELLANEOUS) ×1 IMPLANT
SPONGE LAP 18X18 RF (DISPOSABLE) ×3 IMPLANT

## 2019-11-06 NOTE — Anesthesia Preprocedure Evaluation (Addendum)
Anesthesia Evaluation  Patient identified by MRN, date of birth, ID band Patient awake    Reviewed: Allergy & Precautions, H&P , NPO status , Patient's Chart, lab work & pertinent test results  Airway Mallampati: II  TM Distance: <3 FB Neck ROM: full    Dental  (+) Chipped   Pulmonary pneumonia, resolved, former smoker,  Recently hospitalized for Covid PNA in November, reports he feels fully recovered, no residual cough          Cardiovascular   H/o PE on Eliquis   Neuro/Psych negative neurological ROS  negative psych ROS   GI/Hepatic negative GI ROS, Neg liver ROS,   Endo/Other  diabetes  Renal/GU      Musculoskeletal   Abdominal   Peds  Hematology negative hematology ROS (+)   Anesthesia Other Findings Past Medical History: No date: Diabetes mellitus without complication ([place]) No date: Hypercholesterolemia No date: Type 2 diabetes mellitus with hyperlipidemia ([place])  Past Surgical History: 5 years ago: EYE SURGERY     Comment:  lens No date: Eyelid Surgery; Bilateral 7/11: INTRAOCULAR LENS INSERTION; Left No date: KNEE ARTHROSCOPY; Right  BMI    Body Mass Index: 29.41 kg/m      Reproductive/Obstetrics negative OB ROS                           Anesthesia Physical Anesthesia Plan  ASA: II  Anesthesia Plan: General LMA   Post-op Pain Management:    Induction:   PONV Risk Score and Plan: Ondansetron, Dexamethasone and Treatment may vary due to age or medical condition  Airway Management Planned:   Additional Equipment:   Intra-op Plan:   Post-operative Plan:   Informed Consent: I have reviewed the patients History and Physical, chart, labs and discussed the procedure including the risks, benefits and alternatives for the proposed anesthesia with the patient or authorized representative who has indicated his/her understanding and acceptance.     Dental Advisory  Given  Plan Discussed with: Anesthesiologist  Anesthesia Plan Comments:        Anesthesia Quick Evaluation

## 2019-11-06 NOTE — Transfer of Care (Signed)
Immediate Anesthesia Transfer of Care Note  Patient: Hunter Peters  Procedure(s) Performed: RECTAL EXAM UNDER ANESTHESIA (N/A ) INCISION AND DRAINAGE ABSCESS-perianal abscess (N/A )  Patient Location: PACU  Anesthesia Type:General  Level of Consciousness: sedated  Airway & Oxygen Therapy: Patient Spontanous Breathing and Patient connected to face mask oxygen  Post-op Assessment: Report given to RN and Post -op Vital signs reviewed and stable  Post vital signs: Reviewed and stable  Last Vitals:  Vitals Value Taken Time  BP 125/84 11/06/19 0959  Temp 36.6 C 11/06/19 0959  Pulse 95 11/06/19 1007  Resp 14 11/06/19 1007  SpO2 100 % 11/06/19 1007  Vitals shown include unvalidated device data.  Last Pain:  Vitals:   11/06/19 0959  TempSrc:   PainSc: Asleep         Complications: No apparent anesthesia complications

## 2019-11-06 NOTE — Discharge Instructions (Addendum)

## 2019-11-06 NOTE — Anesthesia Procedure Notes (Signed)
Procedure Name: LMA Insertion Date/Time: 11/06/2019 9:06 AM Performed by: Justus Memory, CRNA Pre-anesthesia Checklist: Patient identified, Patient being monitored, Timeout performed, Emergency Drugs available and Suction available Patient Re-evaluated:Patient Re-evaluated prior to induction Oxygen Delivery Method: Circle system utilized Preoxygenation: Pre-oxygenation with 100% oxygen Induction Type: IV induction Ventilation: Mask ventilation without difficulty LMA: LMA inserted LMA Size: 4.5 Tube type: Oral Number of attempts: 1 Placement Confirmation: positive ETCO2 and breath sounds checked- equal and bilateral Tube secured with: Tape Dental Injury: Teeth and Oropharynx as per pre-operative assessment

## 2019-11-06 NOTE — Anesthesia Post-op Follow-up Note (Signed)
Anesthesia QCDR form completed.        

## 2019-11-06 NOTE — Op Note (Signed)
  11/06/2019  9:54 AM  PATIENT:  Hunter Peters  68 y.o. male  PRE-OPERATIVE DIAGNOSIS:  Perirectall abscess  POST-OPERATIVE DIAGNOSIS:  Same  PROCEDURE:  1. Incision and drainage of complex perirectal abscess right posterolateral position 2. Excisional debridement of skin subcutaneous tissue and muscle  measuring 6cms square centimeters    SURGEON:  Surgeon(s) and Role:    * Ervin Rothbauer F, MD - Primary  FINDINGS: RIGHT POSTEROLATERAL PERIRECTAL ABSCESS No evidence of active fistula or communication with the rectum  ANESTHESIA: GETA  INDICATIONS FOR PROCEDURE Perirectal abscess  DICTATION:  Patient was EXplained  about the procedure in detail; risk, benefits, possible complications and a consent was obtained. The patient taken to the operating room and placed in the lithotomy position. elliptical incision was created and pus was drained and cultured. There was complex luculations that we were able to lyse with a combination of finger fracture and suction device. All the loculations were broen down. Using a sharp curette we debrided the sub q tissue down to the muscle . Hemostasis was obtained with electrocautery. Irrigation with normal saline and the wound was packed with 1/4-inch packing.  Liposomal Marcaine qinjected around the wound site. Needle and laparotomy counts were correct and there were no immediate complications  Jules Husbands, MD

## 2019-11-06 NOTE — Interval H&P Note (Signed)
History and Physical Interval Note:  11/06/2019 8:33 AM  Hunter Peters  has presented today for surgery, with the diagnosis of perirectal abscess.  The various methods of treatment have been discussed with the patient and family. After consideration of risks, benefits and other options for treatment, the patient has consented to  Procedure(s): RECTAL EXAM UNDER ANESTHESIA (N/A) INCISION AND DRAINAGE ABSCESS-perianal abscess (N/A) as a surgical intervention.  The patient's history has been reviewed, patient examined, no change in status, stable for surgery.  I have reviewed the patient's chart and labs.  Questions were answered to the patient's satisfaction.     [place]

## 2019-11-07 ENCOUNTER — Other Ambulatory Visit: Payer: Self-pay | Admitting: Family Medicine

## 2019-11-07 NOTE — Telephone Encounter (Signed)
Medication Refill - Medication: apixaban (ELIQUIS) 5 MG TABS tablet  This rx was sent to the wrong pharmacy.  Pt would like to have it sent to:   Preferred Pharmacy:  [facility] [place], [place] Phone:  [phone]  Fax:  [phone]      Pt was advised that RX refills may take up to 3 business days. We ask that you follow-up with your pharmacy.

## 2019-11-07 NOTE — Telephone Encounter (Signed)
Please transfer Rx

## 2019-11-07 NOTE — Telephone Encounter (Signed)
Called [facility] to have RX transferred from [facility]. Called patient and let him know that this was being done for him.

## 2019-11-07 NOTE — Telephone Encounter (Signed)
Routing to provider. Please resend RX to [facility] per patient request.

## 2019-11-07 NOTE — Anesthesia Postprocedure Evaluation (Signed)
Anesthesia Post Note  Patient: Hunter Peters  Procedure(s) Performed: RECTAL EXAM UNDER ANESTHESIA (N/A ) INCISION AND DRAINAGE ABSCESS-perianal abscess (N/A )  Patient location during evaluation: PACU Anesthesia Type: General Level of consciousness: awake and alert Pain management: pain level controlled Vital Signs Assessment: post-procedure vital signs reviewed and stable Respiratory status: spontaneous breathing, nonlabored ventilation and respiratory function stable Cardiovascular status: blood pressure returned to baseline and stable Postop Assessment: no apparent nausea or vomiting Anesthetic complications: no     Last Vitals:  Vitals:   11/06/19 1056 11/06/19 1144  BP: 119/78 122/70  Pulse: 86 88  Resp: 16 16  Temp: (!) 36.1 C   SpO2: 95% 99%    Last Pain:  Vitals:   11/07/19 0838  TempSrc:   PainSc: [place]

## 2019-11-10 ENCOUNTER — Telehealth: Payer: Self-pay | Admitting: Surgery

## 2019-11-10 LAB — AEROBIC/ANAEROBIC CULTURE W GRAM STAIN (SURGICAL/DEEP WOUND)

## 2019-11-10 NOTE — Telephone Encounter (Signed)
Per Otho Ket, PA-C advised me to inform patient to continue with his Sitz Bath twice daily and when patient is not in the bath. He will continue with packing the wound and applying a gauze. Also, informed patient that he will continue to pack the wound and as the healing process continues he will use less packing material. Patient states he has ordered more due to him running out today, and he should be receiving that tomorrow. Patient verbalized understanding.

## 2019-11-10 NOTE — Telephone Encounter (Signed)
Patient is calling and has some questions regarding wound care. Please call patient and advise.

## 2019-11-17 NOTE — Telephone Encounter (Addendum)
Dr.Johnson, Has the order been placed for a 6 week chest x-ray follow up, also I do see any blood work that you wanted repeated per patient.  Checked last office note.

## 2019-11-17 NOTE — Telephone Encounter (Signed)
Orders in 

## 2019-11-24 ENCOUNTER — Other Ambulatory Visit: Payer: Medicare Other

## 2019-11-24 ENCOUNTER — Ambulatory Visit
Admission: RE | Admit: 2019-11-24 | Discharge: 2019-11-24 | Disposition: A | Discharge status: Home / Self Care | Payer: Medicare Other | Source: Home / Self Care | Attending: Family Medicine | Admitting: Family Medicine

## 2019-11-24 ENCOUNTER — Other Ambulatory Visit: Payer: Self-pay

## 2019-11-24 ENCOUNTER — Ambulatory Visit
Admission: RE | Admit: 2019-11-24 | Discharge: 2019-11-24 | Disposition: A | Discharge status: Home / Self Care | Payer: Medicare Other | Source: Ambulatory Visit | Attending: Family Medicine | Admitting: Family Medicine

## 2019-11-24 DIAGNOSIS — J1282 Pneumonia due to coronavirus disease 2019: Secondary | ICD-10-CM | POA: Insufficient documentation

## 2019-11-24 DIAGNOSIS — I2699 Other pulmonary embolism without acute cor pulmonale: Secondary | ICD-10-CM | POA: Diagnosis not present

## 2019-11-24 DIAGNOSIS — J189 Pneumonia, unspecified organism: Secondary | ICD-10-CM | POA: Diagnosis not present

## 2019-11-24 DIAGNOSIS — U071 COVID-19: Secondary | ICD-10-CM | POA: Diagnosis not present

## 2019-11-25 ENCOUNTER — Other Ambulatory Visit: Payer: Self-pay

## 2019-11-26 ENCOUNTER — Other Ambulatory Visit: Payer: Self-pay

## 2019-11-26 ENCOUNTER — Encounter: Payer: Self-pay | Admitting: Surgery

## 2019-11-26 ENCOUNTER — Ambulatory Visit (INDEPENDENT_AMBULATORY_CARE_PROVIDER_SITE_OTHER): Payer: Self-pay | Admitting: Surgery

## 2019-11-26 VITALS — BP 114/67 | HR 91 | Temp 97.5°F | Resp 14 | Ht 70.0 in | Wt 212.0 lb

## 2019-11-26 DIAGNOSIS — Z09 Encounter for follow-up examination after completed treatment for conditions other than malignant neoplasm: Secondary | ICD-10-CM

## 2019-11-26 NOTE — Patient Instructions (Addendum)
No further packing needed. May use dry gauze to area and change a couple times a day. May need to do a sitz bath after bowel movements to keep area clean.   Follow-up with our office as needed.  Please call and ask to speak with a nurse if you develop questions or concerns.  Due for a colonoscopy in June. We will get you set up for that in June and have them call you.

## 2019-11-26 NOTE — Progress Notes (Signed)
S/p I/d perianal abscess Doing well Wound healing well good granulation, no additional infection  A/P doing well Colonoscopy this summer will get Dr. Vicente Males to perform RTC prn

## 2019-12-02 LAB — HYPERCOAGULABLE PANEL, COMPREHENSIVE
APTT: 26.2 s
AT III Act/Nor PPP Chro: 120 %
Act. Prt C Resist w/FV Defic.: 2.6 ratio
Anticardiolipin Ab, IgG: 10 [GPL'U]
Anticardiolipin Ab, IgM: <10 [MPL'U]
Beta-2 Glycoprotein I, IgA: <10 SAU
Beta-2 Glycoprotein I, IgG: <10 SGU
Beta-2 Glycoprotein I, IgM: <10 SMU
DRVVT Screen Seconds: 46 s
Factor VII Antigen**: 120 %
Factor VIII Activity: 180 % — ABNORMAL HIGH
Hexagonal Phospholipid Neutral: 5 s
Homocysteine: 9.8 umol/L
Prot C Ag Act/Nor PPP Imm: 94 %
Prot S Ag Act/Nor PPP Imm: 107 %
Protein C Ag/FVII Ag Ratio**: 0.8 ratio
Protein S Ag/FVII Ag Ratio**: 0.9 ratio

## 2020-01-24 DIAGNOSIS — Z23 Encounter for immunization: Secondary | ICD-10-CM | POA: Diagnosis not present

## 2020-02-21 DIAGNOSIS — Z23 Encounter for immunization: Secondary | ICD-10-CM | POA: Diagnosis not present

## 2020-02-29 ENCOUNTER — Encounter: Payer: Self-pay | Admitting: Family Medicine

## 2020-02-29 DIAGNOSIS — E7800 Pure hypercholesterolemia, unspecified: Secondary | ICD-10-CM

## 2020-02-29 DIAGNOSIS — E1169 Type 2 diabetes mellitus with other specified complication: Secondary | ICD-10-CM

## 2020-02-29 DIAGNOSIS — E119 Type 2 diabetes mellitus without complications: Secondary | ICD-10-CM

## 2020-02-29 DIAGNOSIS — R3911 Hesitancy of micturition: Secondary | ICD-10-CM

## 2020-02-29 DIAGNOSIS — E785 Hyperlipidemia, unspecified: Secondary | ICD-10-CM

## 2020-02-29 DIAGNOSIS — Z125 Encounter for screening for malignant neoplasm of prostate: Secondary | ICD-10-CM

## 2020-02-29 DIAGNOSIS — I2699 Other pulmonary embolism without acute cor pulmonale: Secondary | ICD-10-CM

## 2020-03-01 NOTE — Telephone Encounter (Signed)
Can labs be placed and sent to [place], and patient do a virtual visit?

## 2020-03-09 ENCOUNTER — Other Ambulatory Visit: Payer: Self-pay

## 2020-03-09 DIAGNOSIS — Z1211 Encounter for screening for malignant neoplasm of colon: Secondary | ICD-10-CM

## 2020-03-22 NOTE — Telephone Encounter (Signed)
Called pt to schedule. Scheduled virtual for 04/14/20 since he has awv that day as well. Pt is wanting to know if he can have labs in so that he can come in office 04/12/20 and do them since he will be in town. Pt also states that his main concern is wanting to get off of eliquis and that he will be out of losartan before appointment. Please advise.

## 2020-03-24 ENCOUNTER — Other Ambulatory Visit: Payer: Self-pay

## 2020-03-24 ENCOUNTER — Telehealth (INDEPENDENT_AMBULATORY_CARE_PROVIDER_SITE_OTHER): Payer: Self-pay | Admitting: Gastroenterology

## 2020-03-24 DIAGNOSIS — Z1211 Encounter for screening for malignant neoplasm of colon: Secondary | ICD-10-CM

## 2020-03-24 NOTE — Progress Notes (Signed)
Gastroenterology Pre-Procedure Review  Request Date: Wed 04/28/20 Requesting Physician: Dr. Marius Ditch  PATIENT REVIEW QUESTIONS: The patient responded to the following health history questions as indicated:    1. Are you having any GI issues? no 2. Do you have a personal history of Polyps? no 3. Do you have a family history of Colon Cancer or Polyps? no 4. Diabetes Mellitus? yes (oral meds) 5. Joint replacements in the past 12 months?no 6. Major health problems in the past 3 months?Perirectal abscess 11/06/2019 7. Any artificial heart valves, MVP, or defibrillator?no    MEDICATIONS & ALLERGIES:    Patient reports the following regarding taking any anticoagulation/antiplatelet therapy:   Plavix, Coumadin, Eliquis, Xarelto, Lovenox, Pradaxa, Brilinta, or Effient? yes (Eliquis-pt states he may no longer need this after his appt with his doctor.  Blood thinner request  will be sent to [facility].) Aspirin? no  Patient confirms/reports the following medications:  Current Outpatient Medications  Medication Sig Dispense Refill  . apixaban (ELIQUIS) 5 MG TABS tablet Take 1 tablet (5 mg total) by mouth 2 (two) times daily. 60 tablet 4  . Ascorbic Acid (VITAMIN C) 500 MG CAPS Take 500 mg by mouth daily.     . cholecalciferol (VITAMIN D3) 25 MCG (1000 UT) tablet Take 1,000 Units by mouth daily.    . metFORMIN (GLUCOPHAGE) 500 MG tablet Take 2 tablets (1,000 mg total) by mouth 2 (two) times daily with a meal. 120 tablet 3  . psyllium (METAMUCIL) 58.6 % packet Take 1 packet by mouth daily.    . rosuvastatin (CRESTOR) 10 MG tablet Take 1 tablet (10 mg total) by mouth daily. 30 tablet 3  . lidocaine-prilocaine (EMLA) cream Apply 1 application topically as needed. (Patient taking differently: Apply 1 application topically as needed (pain). ) 30 g 0  . losartan (COZAAR) 25 MG tablet Take 1 tablet (25 mg total) by mouth daily. 30 tablet 3  . Zinc 50 MG TABS Take 50 mg by mouth daily.      No  current facility-administered medications for this visit.    Patient confirms/reports the following allergies:  Allergies  Allergen Reactions  . Sulfonamide Derivatives     GI Intolerance    No orders of the defined types were placed in this encounter.   AUTHORIZATION INFORMATION Primary Insurance: 1D#: Group #:  Secondary Insurance: 1D#: Group #:  SCHEDULE INFORMATION: Date: Wed 04/28/20 Time: Location:[facility]

## 2020-03-25 MED ORDER — METFORMIN HCL 500 MG PO TABS: 1000.0000 mg | ORAL_TABLET | Freq: Two times a day (BID) | ORAL | 3 refills | Status: DC

## 2020-03-25 MED ORDER — APIXABAN 5 MG PO TABS: 5.0000 mg | ORAL_TABLET | Freq: Two times a day (BID) | ORAL | 4 refills | Status: DC

## 2020-03-25 MED ORDER — ROSUVASTATIN CALCIUM 10 MG PO TABS: 10.0000 mg | ORAL_TABLET | Freq: Every day | ORAL | 3 refills | Status: DC

## 2020-03-25 MED ORDER — LOSARTAN POTASSIUM 25 MG PO TABS: 25.0000 mg | ORAL_TABLET | Freq: Every day | ORAL | 3 refills | Status: DC

## 2020-03-25 NOTE — Telephone Encounter (Signed)
Meds refilled and orders in

## 2020-03-25 NOTE — Telephone Encounter (Signed)
Pt.notified

## 2020-03-28 ENCOUNTER — Other Ambulatory Visit: Payer: Self-pay | Admitting: Family Medicine

## 2020-03-28 NOTE — Telephone Encounter (Signed)
Requested Prescriptions  Pending Prescriptions Disp Refills  . ELIQUIS 5 MG TABS tablet [Pharmacy Med Name: ELIQUIS 5MG  TABLETS] 60 tablet 4    Sig: TAKE 2 TABLETS(10 MG) BY MOUTH TWICE DAILY FOR 6 DAYS THEN TAKE 1 TABLET(5 MG) BY MOUTH TWICE DAILY     Hematology:  Anticoagulants Failed - 03/28/2020  3:19 AM      Failed - PLT in normal range and within 360 days    Platelets  Date Value Ref Range Status  10/18/2019 423 (H) 150 - 400 K/uL Final  08/07/2019 340 150 - 450 x10E3/uL Final         Passed - HGB in normal range and within 360 days    Hemoglobin  Date Value Ref Range Status  10/18/2019 13.8 13.0 - 17.0 g/dL Final  08/07/2019 15.3 13.0 - 17.7 g/dL Final         Passed - HCT in normal range and within 360 days    HCT  Date Value Ref Range Status  10/18/2019 41.8 39.0 - 52.0 % Final   Hematocrit  Date Value Ref Range Status  08/07/2019 45.0 37.5 - 51.0 % Final         Passed - Cr in normal range and within 360 days    Creatinine, Ser  Date Value Ref Range Status  10/18/2019 0.92 0.61 - 1.24 mg/dL Final         Passed - Valid encounter within last 12 months    Recent Outpatient Visits          4 months ago Rectal fissure   [facility] [name], NP   5 months ago Pneumonia due to COVID-19 virus   [facility], Megan P, DO   5 months ago COVID-19   [facility], Megan P, DO   7 months ago Diabetes mellitus associated with hormonal etiology ([place])   [place], Cheryl, NP   7 months ago Diabetes mellitus associated with hormonal etiology [facility])   [place], Jeannette How, MD      Future Appointments            In 2 weeks Johnson, Barb Merino, DO [place] [place], [place]   In 2 weeks  [employer] [employer], [facility]           Received request for refills from [facility] in [place].  Previous refill had been sent to [facility] in [place] on  03/25/2020.

## 2020-03-29 ENCOUNTER — Telehealth: Payer: Self-pay

## 2020-03-29 NOTE — Telephone Encounter (Signed)
Blood Thinner Request received on 03/24/20.  Patient has been advised to stop Eliquis 3 days prior to his 04/28/20 Colonoscopy and to restart 2 days after his procedure.  Thanks,  [place], [place]

## 2020-04-09 ENCOUNTER — Other Ambulatory Visit: Payer: Medicare Other

## 2020-04-09 ENCOUNTER — Other Ambulatory Visit: Payer: Self-pay

## 2020-04-09 DIAGNOSIS — E1169 Type 2 diabetes mellitus with other specified complication: Secondary | ICD-10-CM

## 2020-04-09 DIAGNOSIS — R3911 Hesitancy of micturition: Secondary | ICD-10-CM | POA: Diagnosis not present

## 2020-04-09 DIAGNOSIS — E7800 Pure hypercholesterolemia, unspecified: Secondary | ICD-10-CM | POA: Diagnosis not present

## 2020-04-09 DIAGNOSIS — I2699 Other pulmonary embolism without acute cor pulmonale: Secondary | ICD-10-CM

## 2020-04-09 DIAGNOSIS — E119 Type 2 diabetes mellitus without complications: Secondary | ICD-10-CM | POA: Diagnosis not present

## 2020-04-09 DIAGNOSIS — E785 Hyperlipidemia, unspecified: Secondary | ICD-10-CM

## 2020-04-09 LAB — UA/M W/RFLX CULTURE, ROUTINE
Bilirubin, UA: NEGATIVE
Glucose, UA: NEGATIVE
Ketones, UA: NEGATIVE
Leukocytes,UA: NEGATIVE
Nitrite, UA: NEGATIVE
Protein,UA: NEGATIVE
RBC, UA: NEGATIVE
Specific Gravity, UA: 1.025 (ref 1.005–1.030)
Urobilinogen, Ur: 0.2 mg/dL (ref 0.2–1.0)
pH, UA: 5 (ref 5.0–7.5)

## 2020-04-09 LAB — MICROALBUMIN, URINE WAIVED
Creatinine, Urine Waived: 200 mg/dL (ref 10–300)
Microalb, Ur Waived: 10 mg/L (ref 0–19)
Microalb/Creat Ratio: <30 mg/g (ref <30)

## 2020-04-09 LAB — BAYER DCA HB A1C WAIVED: HB A1C (BAYER DCA - WAIVED): 6.3 % (ref <7.0)

## 2020-04-10 LAB — COMPREHENSIVE METABOLIC PANEL
ALT: 15 IU/L (ref 0–44)
AST: 15 IU/L (ref 0–40)
Albumin/Globulin Ratio: 2 (ref 1.2–2.2)
Albumin: 4.5 g/dL (ref 3.8–4.8)
Alkaline Phosphatase: 39 IU/L — ABNORMAL LOW (ref 48–121)
BUN/Creatinine Ratio: 17 (ref 10–24)
BUN: 16 mg/dL (ref 8–27)
Bilirubin Total: 0.8 mg/dL (ref 0.0–1.2)
CO2: 23 mmol/L (ref 20–29)
Calcium: 9.2 mg/dL (ref 8.6–10.2)
Chloride: 104 mmol/L (ref 96–106)
Creatinine, Ser: 0.94 mg/dL (ref 0.76–1.27)
GFR calc Af Amer: 97 mL/min/{1.73_m2} (ref >59)
GFR calc non Af Amer: 84 mL/min/{1.73_m2} (ref >59)
Globulin, Total: 2.2 g/dL (ref 1.5–4.5)
Glucose: 128 mg/dL — ABNORMAL HIGH (ref 65–99)
Potassium: 4.6 mmol/L (ref 3.5–5.2)
Sodium: 142 mmol/L (ref 134–144)
Total Protein: 6.7 g/dL (ref 6.0–8.5)

## 2020-04-10 LAB — PSA: Prostate Specific Ag, Serum: 0.7 ng/mL (ref 0.0–4.0)

## 2020-04-10 LAB — CBC WITH DIFFERENTIAL/PLATELET
Basophils Absolute: 0.1 10*3/uL (ref 0.0–0.2)
Basos: 1 %
EOS (ABSOLUTE): 0.1 10*3/uL (ref 0.0–0.4)
Eos: 3 %
Hematocrit: 42.7 % (ref 37.5–51.0)
Hemoglobin: 14.2 g/dL (ref 13.0–17.7)
Immature Grans (Abs): 0 10*3/uL (ref 0.0–0.1)
Immature Granulocytes: 0 %
Lymphocytes Absolute: 1.5 10*3/uL (ref 0.7–3.1)
Lymphs: 31 %
MCH: 29.2 pg (ref 26.6–33.0)
MCHC: 33.3 g/dL (ref 31.5–35.7)
MCV: 88 fL (ref 79–97)
Monocytes Absolute: 0.5 10*3/uL (ref 0.1–0.9)
Monocytes: 10 %
Neutrophils Absolute: 2.7 10*3/uL (ref 1.4–7.0)
Neutrophils: 55 %
Platelets: 316 10*3/uL (ref 150–450)
RBC: 4.87 x10E6/uL (ref 4.14–5.80)
RDW: 13.9 % (ref 11.6–15.4)
WBC: 4.9 10*3/uL (ref 3.4–10.8)

## 2020-04-10 LAB — LIPID PANEL W/O CHOL/HDL RATIO
Cholesterol, Total: 115 mg/dL (ref 100–199)
HDL: 53 mg/dL (ref >39)
LDL Chol Calc (NIH): 46 mg/dL (ref 0–99)
Triglycerides: 79 mg/dL (ref 0–149)
VLDL Cholesterol Cal: 16 mg/dL (ref 5–40)

## 2020-04-10 LAB — TSH: TSH: 1.64 u[IU]/mL (ref 0.450–4.500)

## 2020-04-12 ENCOUNTER — Ambulatory Visit: Payer: Medicare Other | Admitting: Family Medicine

## 2020-04-12 ENCOUNTER — Ambulatory Visit: Payer: Medicare Other

## 2020-04-13 ENCOUNTER — Ambulatory Visit: Payer: Medicare Other | Admitting: Family Medicine

## 2020-04-14 ENCOUNTER — Ambulatory Visit (INDEPENDENT_AMBULATORY_CARE_PROVIDER_SITE_OTHER): Payer: Medicare Other

## 2020-04-14 ENCOUNTER — Encounter: Payer: Self-pay | Admitting: Family Medicine

## 2020-04-14 ENCOUNTER — Other Ambulatory Visit: Payer: Self-pay

## 2020-04-14 ENCOUNTER — Telehealth (INDEPENDENT_AMBULATORY_CARE_PROVIDER_SITE_OTHER): Payer: Medicare Other | Admitting: Family Medicine

## 2020-04-14 ENCOUNTER — Telehealth: Payer: Self-pay

## 2020-04-14 ENCOUNTER — Telehealth: Payer: Self-pay | Admitting: Family Medicine

## 2020-04-14 VITALS — BP 103/71 | HR 66 | Temp 98.5°F | Ht 70.0 in | Wt 209.6 lb

## 2020-04-14 DIAGNOSIS — I2699 Other pulmonary embolism without acute cor pulmonale: Secondary | ICD-10-CM

## 2020-04-14 DIAGNOSIS — Z0000 Encounter for general adult medical examination without abnormal findings: Secondary | ICD-10-CM

## 2020-04-14 DIAGNOSIS — E785 Hyperlipidemia, unspecified: Secondary | ICD-10-CM | POA: Diagnosis not present

## 2020-04-14 DIAGNOSIS — E7800 Pure hypercholesterolemia, unspecified: Secondary | ICD-10-CM | POA: Diagnosis not present

## 2020-04-14 DIAGNOSIS — E1169 Type 2 diabetes mellitus with other specified complication: Secondary | ICD-10-CM

## 2020-04-14 DIAGNOSIS — N529 Male erectile dysfunction, unspecified: Secondary | ICD-10-CM | POA: Diagnosis not present

## 2020-04-14 MED ORDER — METFORMIN HCL 500 MG PO TABS: 1000.0000 mg | ORAL_TABLET | Freq: Two times a day (BID) | ORAL | 1 refills | Status: DC

## 2020-04-14 MED ORDER — ROSUVASTATIN CALCIUM 10 MG PO TABS: 10.0000 mg | ORAL_TABLET | Freq: Every day | ORAL | 1 refills | Status: DC

## 2020-04-14 MED ORDER — LOSARTAN POTASSIUM 25 MG PO TABS: 25.0000 mg | ORAL_TABLET | Freq: Every day | ORAL | 1 refills | Status: DC

## 2020-04-14 MED ORDER — TADALAFIL 20 MG PO TABS: 10.0000 mg | ORAL_TABLET | ORAL | 11 refills | Status: DC | PRN

## 2020-04-14 NOTE — Progress Notes (Signed)
There were no vitals taken for this visit.   Subjective:    Patient ID: Hunter Peters, male    DOB: [birth year], 69 y.o.   MRN: [id number]  HPI: Hunter Peters is a 69 y.o. male  Chief Complaint  Patient presents with  . Diabetes    Patient would like to know if he needs any changes in medications.   . blood clots    Patient states that when he had COVID he developed blood clots and was told he needed to be on the Eliquis for 6 months and that is up so he would like to discuss getting off of it.   Has recovered from the [place].   DIABETES Hypoglycemic episodes:no Polydipsia/polyuria: no Visual disturbance: no Chest pain: no Paresthesias: no Glucose Monitoring: yes  Accucheck frequency: Daily  Fasting glucose: 120s-140s Taking Insulin?: no Blood Pressure Monitoring: rarely Retinal Examination: Not up to Date Foot Exam: Up to Date Diabetic Education: Completed Pneumovax: Up to Date Influenza: Up to Date Aspirin: yes  HYPERLIPIDEMIA Hyperlipidemia status: stable Satisfied with current treatment?  yes Side effects:  no Medication compliance: excellent compliance Past cholesterol meds: crestor Supplements: none Aspirin:  yes The ASCVD Risk score [name], et al., 2013) failed to calculate for the following reasons:   The valid total cholesterol range is 130 to 320 mg/dL Chest pain:  no Coronary artery disease:  no  Relevant past medical, surgical, family and social history reviewed and updated as indicated. Interim medical history since our last visit reviewed. Allergies and medications reviewed and updated.  Review of Systems  Constitutional: Negative.   Respiratory: Negative.   Cardiovascular: Negative.   Gastrointestinal: Negative.   Musculoskeletal: Negative.   Neurological: Negative.   Psychiatric/Behavioral: Negative.     Per HPI unless specifically indicated above     Objective:    There were no vitals taken for this visit.  Wt Readings from Last  3 Encounters:  11/26/19 212 lb (96.2 kg)  11/06/19 205 lb (93 kg)  11/04/19 203 lb (92.1 kg)    Physical Exam Vitals and nursing note reviewed.  Constitutional:      General: He is not in acute distress.    Appearance: Normal appearance. He is not ill-appearing, toxic-appearing or diaphoretic.  HENT:     Head: Normocephalic and atraumatic.     Right Ear: External ear normal.     Left Ear: External ear normal.     Nose: Nose normal.     Mouth/Throat:     Mouth: Mucous membranes are moist.     Pharynx: Oropharynx is clear.  Eyes:     General: No scleral icterus.       Right eye: No discharge.        Left eye: No discharge.     Conjunctiva/sclera: Conjunctivae normal.     Pupils: Pupils are equal, round, and reactive to light.  Pulmonary:     Effort: Pulmonary effort is normal. No respiratory distress.     Comments: Speaking in full sentences Musculoskeletal:        General: Normal range of motion.     Cervical back: Normal range of motion.  Skin:    Coloration: Skin is not jaundiced or pale.     Findings: No bruising, erythema, lesion or rash.  Neurological:     Mental Status: He is alert and oriented to person, place, and time. Mental status is at baseline.  Psychiatric:  Mood and Affect: Mood normal.        Behavior: Behavior normal.        Thought Content: Thought content normal.        Judgment: Judgment normal.     Results for orders placed or performed in visit on 04/09/20  UA/M w/rflx Culture, Routine   Specimen: Urine   URINE  Result Value Ref Range   Specific Gravity, UA 1.025 1.005 - 1.030   pH, UA 5.0 5.0 - 7.5   Color, UA Yellow Yellow   Appearance Ur Clear Clear   Leukocytes,UA Negative Negative   Protein,UA Negative Negative/Trace   Glucose, UA Negative Negative   Ketones, UA Negative Negative   RBC, UA Negative Negative   Bilirubin, UA Negative Negative   Urobilinogen, Ur 0.2 0.2 - 1.0 mg/dL   Nitrite, UA Negative Negative  TSH  Result  Value Ref Range   TSH 1.640 0.450 - 4.500 uIU/mL  PSA  Result Value Ref Range   Prostate Specific Ag, Serum 0.7 0.0 - 4.0 ng/mL  Microalbumin, Urine Waived  Result Value Ref Range   Microalb, Ur Waived 10 0 - 19 mg/L   Creatinine, Urine Waived 200 10 - 300 mg/dL   Microalb/Creat Ratio <30 <30 mg/g  Lipid Panel w/o Chol/HDL Ratio  Result Value Ref Range   Cholesterol, Total 115 100 - 199 mg/dL   Triglycerides 79 0 - 149 mg/dL   HDL 53 >39 mg/dL   VLDL Cholesterol Cal 16 5 - 40 mg/dL   LDL Chol Calc (NIH) 46 0 - 99 mg/dL  Comprehensive metabolic panel  Result Value Ref Range   Glucose 128 (H) 65 - 99 mg/dL   BUN 16 8 - 27 mg/dL   Creatinine, Ser 0.94 0.76 - 1.27 mg/dL   GFR calc non Af Amer 84 >59 mL/min/1.73   GFR calc Af Amer 97 >59 mL/min/1.73   BUN/Creatinine Ratio 17 10 - 24   Sodium 142 134 - 144 mmol/L   Potassium 4.6 3.5 - 5.2 mmol/L   Chloride 104 96 - 106 mmol/L   CO2 23 20 - 29 mmol/L   Calcium 9.2 8.6 - 10.2 mg/dL   Total Protein 6.7 6.0 - 8.5 g/dL   Albumin 4.5 3.8 - 4.8 g/dL   Globulin, Total 2.2 1.5 - 4.5 g/dL   Albumin/Globulin Ratio 2.0 1.2 - 2.2   Bilirubin Total 0.8 0.0 - 1.2 mg/dL   Alkaline Phosphatase 39 (L) 48 - 121 IU/L   AST 15 0 - 40 IU/L   ALT 15 0 - 44 IU/L  CBC with Differential/Platelet  Result Value Ref Range   WBC 4.9 3.4 - 10.8 x10E3/uL   RBC 4.87 4.14 - 5.80 x10E6/uL   Hemoglobin 14.2 13.0 - 17.7 g/dL   Hematocrit 42.7 37.5 - 51.0 %   MCV 88 79 - 97 fL   MCH 29.2 26.6 - 33.0 pg   MCHC 33.3 31.5 - 35.7 g/dL   RDW 13.9 11.6 - 15.4 %   Platelets 316 150 - 450 x10E3/uL   Neutrophils 55 Not Estab. %   Lymphs 31 Not Estab. %   Monocytes 10 Not Estab. %   Eos 3 Not Estab. %   Basos 1 Not Estab. %   Neutrophils Absolute 2.7 1.4 - 7.0 x10E3/uL   Lymphocytes Absolute 1.5 0.7 - 3.1 x10E3/uL   Monocytes Absolute 0.5 0.1 - 0.9 x10E3/uL   EOS (ABSOLUTE) 0.1 0.0 - 0.4 x10E3/uL   Basophils Absolute 0.1 0.0 -  0.2 x10E3/uL   Immature  Granulocytes 0 Not Estab. %   Immature Grans (Abs) 0.0 0.0 - 0.1 x10E3/uL  Bayer DCA Hb A1c Waived  Result Value Ref Range   HB A1C (BAYER DCA - WAIVED) 6.3 <7.0 %      Assessment & Plan:   Problem List Items Addressed This Visit      Cardiovascular and Mediastinum   Acute pulmonary embolism (HCC)    Provoked event with COVID-19. Has been on eliquis x 6 months. Will check hypercoalgulable panel and stop eliquis. Call with any concerns. Continue to monitor.       Relevant Medications   rosuvastatin (CRESTOR) 10 MG tablet   losartan (COZAAR) 25 MG tablet   tadalafil (CIALIS) 20 MG tablet   Other Relevant Orders   Hypercoagulable panel, comprehensive     Endocrine   Type 2 diabetes mellitus with hyperlipidemia ([place]) - Primary    Doing well with A1c of 6.3. Continue current regimen. Continue to monitor. Recheck 6 months. Call with any concerns.       Relevant Medications   rosuvastatin (CRESTOR) 10 MG tablet   metFORMIN (GLUCOPHAGE) 500 MG tablet   losartan (COZAAR) 25 MG tablet   tadalafil (CIALIS) 20 MG tablet     Other   Hypercholesterolemia    Under good control on current regimen. Continue current regimen. Continue to monitor. Call with any concerns. Refills given. Recheck 6 months.        Relevant Medications   rosuvastatin (CRESTOR) 10 MG tablet   losartan (COZAAR) 25 MG tablet   tadalafil (CIALIS) 20 MG tablet    Other Visit Diagnoses    Erectile dysfunction, unspecified erectile dysfunction type       Will start cialis. Call with any concerns or if not working. Conitnue to monitor.        Follow up plan: Return October- follow up.

## 2020-04-14 NOTE — Assessment & Plan Note (Signed)
Provoked event with COVID-19. Has been on eliquis x 6 months. Will check hypercoalgulable panel and stop eliquis. Call with any concerns. Continue to monitor.

## 2020-04-14 NOTE — Patient Instructions (Signed)
Hunter Peters , Thank you for taking time to come for your Medicare Wellness Visit. I appreciate your ongoing commitment to your health goals. Please review the following plan we discussed and let me know if I can assist you in the future.   Screening recommendations/referrals: Colonoscopy: 05/18/2010, scheduled 04/28/2020  Recommended yearly ophthalmology/optometry visit for glaucoma screening and checkup Recommended yearly dental visit for hygiene and checkup  Vaccinations: Influenza vaccine: due 07/2020 Pneumococcal vaccine: booster due 08/2020 Tdap vaccine: due now, check with insurance for coverage  Shingles vaccine: shingrix completed     Covid-19: completed   Advanced directives: Please bring a copy of your health care power of attorney and living will to the office at your convenience.  Conditions/risks identified: diabetic, discussed chronic care management team   Next appointment: Follow up in one year for your annual wellness   Preventive Care 65 Years and Older, Male Preventive care refers to lifestyle choices and visits with your health care provider that can promote health and wellness. What does preventive care include?  A yearly physical exam. This is also called an annual well check.  Dental exams once or twice a year.  Routine eye exams. Ask your health care provider how often you should have your eyes checked.  Personal lifestyle choices, including:  Daily care of your teeth and gums.  Regular physical activity.  Eating a healthy diet.  Avoiding tobacco and drug use.  Limiting alcohol use.  Practicing safe sex.  Taking low doses of aspirin every day.  Taking vitamin and mineral supplements as recommended by your health care provider. What happens during an annual well check? The services and screenings done by your health care provider during your annual well check will depend on your age, overall health, lifestyle risk factors, and family history of  disease. Counseling  Your health care provider may ask you questions about your:  Alcohol use.  Tobacco use.  Drug use.  Emotional well-being.  Home and relationship well-being.  Sexual activity.  Eating habits.  History of falls.  Memory and ability to understand (cognition).  Work and work Statistician. Screening  You may have the following tests or measurements:  Height, weight, and BMI.  Blood pressure.  Lipid and cholesterol levels. These may be checked every 5 years, or more frequently if you are over 48 years old.  Skin check.  Lung cancer screening. You may have this screening every year starting at age 13 if you have a 30-pack-year history of smoking and currently smoke or have quit within the past 15 years.  Fecal occult blood test (FOBT) of the stool. You may have this test every year starting at age 76.  Flexible sigmoidoscopy or colonoscopy. You may have a sigmoidoscopy every 5 years or a colonoscopy every 10 years starting at age 30.  Prostate cancer screening. Recommendations will vary depending on your family history and other risks.  Hepatitis C blood test.  Hepatitis B blood test.  Sexually transmitted disease (STD) testing.  Diabetes screening. This is done by checking your blood sugar (glucose) after you have not eaten for a while (fasting). You may have this done every 1-3 years.  Abdominal aortic aneurysm (AAA) screening. You may need this if you are a current or former smoker.  Osteoporosis. You may be screened starting at age 67 if you are at high risk. Talk with your health care provider about your test results, treatment options, and if necessary, the need for more tests. Vaccines  Your  health care provider may recommend certain vaccines, such as:  Influenza vaccine. This is recommended every year.  Tetanus, diphtheria, and acellular pertussis (Tdap, Td) vaccine. You may need a Td booster every 10 years.  Zoster vaccine. You may  need this after age 61.  Pneumococcal 13-valent conjugate (PCV13) vaccine. One dose is recommended after age 47.  Pneumococcal polysaccharide (PPSV23) vaccine. One dose is recommended after age 73. Talk to your health care provider about which screenings and vaccines you need and how often you need them. This information is not intended to replace advice given to you by your health care provider. Make sure you discuss any questions you have with your health care provider. Document Released: 12/03/2015 Document Revised: 07/26/2016 Document Reviewed: 09/07/2015 Elsevier Interactive Patient Education  2017 [place] Prevention in the Home Falls can cause injuries. They can happen to people of all ages. There are many things you can do to make your home safe and to help prevent falls. What can I do on the outside of my home?  Regularly fix the edges of walkways and driveways and fix any cracks.  Remove anything that might make you trip as you walk through a door, such as a raised step or threshold.  Trim any bushes or trees on the path to your home.  Use bright outdoor lighting.  Clear any walking paths of anything that might make someone trip, such as rocks or tools.  Regularly check to see if handrails are loose or broken. Make sure that both sides of any steps have handrails.  Any raised decks and porches should have guardrails on the edges.  Have any leaves, snow, or ice cleared regularly.  Use sand or salt on walking paths during winter.  Clean up any spills in your garage right away. This includes oil or grease spills. What can I do in the bathroom?  Use night lights.  Install grab bars by the toilet and in the tub and shower. Do not use towel bars as grab bars.  Use non-skid mats or decals in the tub or shower.  If you need to sit down in the shower, use a plastic, non-slip stool.  Keep the floor dry. Clean up any water that spills on the floor as soon as it  happens.  Remove soap buildup in the tub or shower regularly.  Attach bath mats securely with double-sided non-slip rug tape.  Do not have throw rugs and other things on the floor that can make you trip. What can I do in the bedroom?  Use night lights.  Make sure that you have a light by your bed that is easy to reach.  Do not use any sheets or blankets that are too big for your bed. They should not hang down onto the floor.  Have a firm chair that has side arms. You can use this for support while you get dressed.  Do not have throw rugs and other things on the floor that can make you trip. What can I do in the kitchen?  Clean up any spills right away.  Avoid walking on wet floors.  Keep items that you use a lot in easy-to-reach places.  If you need to reach something above you, use a strong step stool that has a grab bar.  Keep electrical cords out of the way.  Do not use floor polish or wax that makes floors slippery. If you must use wax, use non-skid floor wax.  Do not  have throw rugs and other things on the floor that can make you trip. What can I do with my stairs?  Do not leave any items on the stairs.  Make sure that there are handrails on both sides of the stairs and use them. Fix handrails that are broken or loose. Make sure that handrails are as long as the stairways.  Check any carpeting to make sure that it is firmly attached to the stairs. Fix any carpet that is loose or worn.  Avoid having throw rugs at the top or bottom of the stairs. If you do have throw rugs, attach them to the floor with carpet tape.  Make sure that you have a light switch at the top of the stairs and the bottom of the stairs. If you do not have them, ask someone to add them for you. What else can I do to help prevent falls?  Wear shoes that:  Do not have high heels.  Have rubber bottoms.  Are comfortable and fit you well.  Are closed at the toe. Do not wear sandals.  If you  use a stepladder:  Make sure that it is fully opened. Do not climb a closed stepladder.  Make sure that both sides of the stepladder are locked into place.  Ask someone to hold it for you, if possible.  Clearly Iann and make sure that you can see:  Any grab bars or handrails.  First and last steps.  Where the edge of each step is.  Use tools that help you move around (mobility aids) if they are needed. These include:  Canes.  Walkers.  Scooters.  Crutches.  Turn on the lights when you go into a dark area. Replace any light bulbs as soon as they burn out.  Set up your furniture so you have a clear path. Avoid moving your furniture around.  If any of your floors are uneven, fix them.  If there are any pets around you, be aware of where they are.  Review your medicines with your doctor. Some medicines can make you feel dizzy. This can increase your chance of falling. Ask your doctor what other things that you can do to help prevent falls. This information is not intended to replace advice given to you by your health care provider. Make sure you discuss any questions you have with your health care provider. Document Released: 09/02/2009 Document Revised: 04/13/2016 Document Reviewed: 12/11/2014 Elsevier Interactive Patient Education  2017 [employer].

## 2020-04-14 NOTE — Assessment & Plan Note (Signed)
Doing well with A1c of 6.3. Continue current regimen. Continue to monitor. Recheck 6 months. Call with any concerns.

## 2020-04-14 NOTE — Telephone Encounter (Signed)
PA for Tadalifil initiated and submitted via Cover My Meds. Key: [id number]

## 2020-04-14 NOTE — Telephone Encounter (Signed)
Pt stated he would call back to schedule this apt.

## 2020-04-14 NOTE — Telephone Encounter (Signed)
-----   Message from Valerie Roys, [place] sent at 04/14/2020 10:31 AM EDT ----- October- follow up

## 2020-04-14 NOTE — Assessment & Plan Note (Signed)
Under good control on current regimen. Continue current regimen. Continue to monitor. Call with any concerns. Refills given. Recheck 6 months.   

## 2020-04-14 NOTE — Progress Notes (Signed)
Subjective:   Hunter Peters is a 70 y.o. male who presents for an Initial Medicare Annual Wellness Visit.  Review of Systems   Cardiac Risk Factors include: advanced age (>59men, >72 women);male gender;dyslipidemia;hypertension;diabetes mellitus    Objective:    Today's Vitals   04/14/20 1034  BP: 103/71  Pulse: 66  Temp: 98.5 F (36.9 C)  TempSrc: Oral  Weight: 209 lb 9.6 oz (95.1 kg)  Height: 5\' 10"  (1.778 m)   Body mass index is 30.07 kg/m.  Advanced Directives 04/14/2020 10/14/2019 10/12/2019  Does Patient Have a Medical Advance Directive? Yes Yes Yes  Type of Advance Directive Living will;Healthcare Power of Attorney Living will;Healthcare Power of [place];Living will;Out of facility DNR (pink MOST or yellow form)  Does patient want to make changes to medical advance directive? - No - Patient declined No - Patient declined  Copy of [place] in Chart? No - copy requested No - copy requested No - copy requested    Current Medications (verified) Outpatient Encounter Medications as of 04/14/2020  Medication Sig  . Ascorbic Acid (VITAMIN C) 500 MG CAPS Take 500 mg by mouth daily.   . cholecalciferol (VITAMIN D3) 25 MCG (1000 UT) tablet Take 1,000 Units by mouth daily.  Marland Kitchen ELIQUIS 5 MG TABS tablet TAKE 2 TABLETS(10 MG) BY MOUTH TWICE DAILY FOR 6 DAYS THEN TAKE 1 TABLET(5 MG) BY MOUTH TWICE DAILY  . lidocaine-prilocaine (EMLA) cream Apply 1 application topically as needed. (Patient taking differently: Apply 1 application topically as needed (pain). )  . losartan (COZAAR) 25 MG tablet Take 1 tablet (25 mg total) by mouth daily.  . metFORMIN (GLUCOPHAGE) 500 MG tablet Take 2 tablets (1,000 mg total) by mouth 2 (two) times daily with a meal.  . psyllium (METAMUCIL) 58.6 % packet Take 1 packet by mouth daily.  . rosuvastatin (CRESTOR) 10 MG tablet Take 1 tablet (10 mg total) by mouth daily.  . tadalafil (CIALIS) 20 MG tablet Take  0.5-1 tablets (10-20 mg total) by mouth every other day as needed for erectile dysfunction.   No facility-administered encounter medications on file as of 04/14/2020.    Allergies (verified) Sulfonamide derivatives   History: Past Medical History:  Diagnosis Date  . Acute respiratory failure with hypoxia ([place]) 10/13/2019  . Diabetes mellitus without complication ([place])   . Hypercholesterolemia   . Osteoarthritis of knee 01/23/2019  . Type 2 diabetes mellitus with hyperlipidemia [facility])    Past Surgical History:  Procedure Laterality Date  . EYE SURGERY  5 years ago   lens  . Eyelid Surgery Bilateral   . INCISION AND DRAINAGE ABSCESS N/A 11/06/2019   Procedure: INCISION AND DRAINAGE ABSCESS-perianal abscess;  Surgeon: Jules Husbands, MD;  Location: [facility] ORS;  Service: General;  Laterality: N/A;  . INTRAOCULAR LENS INSERTION Left 7/11  . KNEE ARTHROSCOPY Right   . RECTAL EXAM UNDER ANESTHESIA N/A 11/06/2019   Procedure: RECTAL EXAM UNDER ANESTHESIA;  Surgeon: Jules Husbands, MD;  Location: [facility] ORS;  Service: General;  Laterality: N/A;   Family History  Problem Relation Age of Onset  . Alzheimer's disease Mother   . Hypertension Father    Social History   Socioeconomic History  . Marital status: Married    Spouse name: Not on file  . Number of children: Not on file  . Years of education: Not on file  . Highest education level: Not on file  Occupational History  . Not on  file  Tobacco Use  . Smoking status: Former Smoker    Packs/day: 0.00    Years: 0.00    Pack years: 0.00    Types: Cigars  . Smokeless tobacco: Never Used  . Tobacco comment: occ.  Substance and Sexual Activity  . Alcohol use: Yes    Alcohol/week: 0.0 standard drinks    Comment: occasional  . Drug use: No  . Sexual activity: Yes    Birth control/protection: None  Other Topics Concern  . Not on file  Social History Narrative  . Not on file   Social Determinants of Health   Financial Resource  Strain:   . Difficulty of Paying Living Expenses:   Food Insecurity:   . Worried About Charity fundraiser in the Last Year:   . Arboriculturist in the Last Year:   Transportation Needs:   . Film/video editor (Medical):   Marland Kitchen Lack of Transportation (Non-Medical):   Physical Activity:   . Days of Exercise per Week:   . Minutes of Exercise per Session:   Stress:   . Feeling of Stress :   Social Connections:   . Frequency of Communication with Friends and Family:   . Frequency of Social Gatherings with Friends and Family:   . Attends Religious Services:   . Active Member of Clubs or Organizations:   . Attends Archivist Meetings:   Marland Kitchen Marital Status:    Tobacco Counseling Counseling given: Not Answered Comment: occ.  Clinical Intake:  Pre-visit preparation completed: Yes  Pain : No/denies pain     Nutritional Risks: None Diabetes: Yes CBG done?: No Did pt. bring in CBG monitor from home?: No  How often do you need to have someone help you when you read instructions, pamphlets, or other written materials from your doctor or pharmacy?: 1 - Never  Interpreter Needed?: No  Information entered by :: Marylen Zuk,LPN  Activities of Daily Living In your present state of health, do you have any difficulty performing the following activities: 04/14/2020 10/14/2019  Hearing? N N  Vision? N N  Difficulty concentrating or making decisions? N N  Walking or climbing stairs? N N  Dressing or bathing? N N  Doing errands, shopping? N Y  Conservation officer, nature and eating ? N -  Using the Toilet? N -  In the past six months, have you accidently leaked urine? N -  Do you have problems with loss of bowel control? N -  Managing your Medications? N -  Managing your Finances? N -  Housekeeping or managing your Housekeeping? N -  Some recent data might be hidden     Immunizations and Health Maintenance Immunization History  Administered Date(s) Administered  . Influenza, High  Dose Seasonal PF 09/04/2019  . Influenza-Unspecified 10/17/2017, 09/02/2018  . PFIZER SARS-COV-2 Vaccination 01/19/2020, 02/09/2020  . Pneumococcal Conjugate-13 09/04/2019  . Td 06/11/2006  . Zoster Recombinat (Shingrix) 10/17/2017, 02/04/2018   Health Maintenance Due  Topic Date Due  . OPHTHALMOLOGY EXAM  Never done    Patient Care Team: Guadalupe Maple, MD as PCP - General (Family Medicine) Guadalupe Maple, MD as PCP - Family Medicine (Family Medicine)  Indicate any recent Medical Services you may have received from other than [facility] providers in the past year (date may be approximate).    Assessment:   This is a routine wellness examination for [employer].  Hearing/Vision screen  Hearing Screening   125Hz  250Hz  500Hz  1000Hz  2000Hz  3000Hz  4000Hz   6000Hz  8000Hz   Right ear:           Left ear:           Vision Screening Comments: Phillip bell   Dietary issues and exercise activities discussed: Current Exercise Habits: Home exercise routine(golf 4 times a week), Time (Minutes): 30, Frequency (Times/Week): 4, Weekly Exercise (Minutes/Week): 120, Intensity: Mild, Exercise limited by: None identified  Goals Addressed   None    Depression Screen PHQ 2/9 Scores 04/14/2020 08/07/2019  PHQ - 2 Score 0 0  PHQ- 9 Score - 0    Fall Risk Fall Risk  04/14/2020 11/26/2019 11/05/2019 08/07/2019  Falls in the past year? 0 0 0 0  Number falls in past yr: 0 0 0 0  Injury with Fall? 0 0 0 0  Follow up - - - Falls evaluation completed    FALL RISK PREVENTION PERTAINING TO THE HOME:  Any stairs in or around the home? Yes  If so, are there any without handrails? No   Home free of loose throw rugs in walkways, pet beds, electrical cords, etc? Yes  Adequate lighting in your home to reduce risk of falls? Yes   ASSISTIVE DEVICES UTILIZED TO PREVENT FALLS:  Life alert? No  Use of a cane, walker or w/c? No  Grab bars in the bathroom? No  Shower chair or bench in shower? No  Elevated toilet seat  or a handicapped toilet? No    TIMED UP AND GO:  Was the test performed? Yes .  Length of time to ambulate 10 feet: 8 sec.   GAIT:  Appearance of gait: Gait steady and fast without the use of an assistive device.  Education: Fall risk prevention has been discussed.  Intervention(s) required? No   DME/home health order needed?  No    Cognitive Function:     6CIT Screen 04/14/2020  What Year? 0 points  What month? 0 points  What time? 0 points  Count back from 20 0 points  Months in reverse 0 points  Repeat phrase 0 points  Total Score 0    Screening Tests Health Maintenance  Topic Date Due  . OPHTHALMOLOGY EXAM  Never done  . TETANUS/TDAP  08/06/2020 (Originally 06/11/2016)  . COLONOSCOPY  05/18/2020  . INFLUENZA VACCINE  06/20/2020  . FOOT EXAM  08/06/2020  . PNA vac Low Risk Adult (2 of 2 - PPSV23) 09/03/2020  . HEMOGLOBIN A1C  10/10/2020  . COVID-19 Vaccine  Completed  . Hepatitis C Screening  Completed    Qualifies for Shingles Vaccine? Yes  Zostavax completed n/a Due for Shingrix. Education has been provided regarding the importance of this vaccine. Pt has been advised to call insurance company to determine out of pocket expense. Advised may also receive vaccine at local pharmacy or Health Dept. Verbalized acceptance and understanding.  Tdap: Although this vaccine is not a covered service during a Wellness Exam, does the patient still wish to receive this vaccine today?  No .  Education has been provided regarding the importance of this vaccine. Advised may receive this vaccine at local pharmacy or Health Dept. Aware to provide a copy of the vaccination record if obtained from local pharmacy or Health Dept. Verbalized acceptance and understanding.  Flu Vaccine: due 07/2020  Pneumococcal Vaccine: first dose completed, second dose due 08/2020  Covid-19 Vaccine:  Completed vaccines  Cancer Screenings:  Colorectal Screening: Completed 05/18/2010, scheduled  04/28/2020  Lung Cancer Screening: (Low Dose CT Chest recommended if Age 7-80  years, 30 pack-year currently smoking OR have quit w/in 15years.) does not qualify.     Additional Screening:  Hepatitis C Screening: does qualify; Completed 2017  Vision Screening: Recommended annual ophthalmology exams for early detection of glaucoma and other disorders of the eye. Is the patient up to date with their annual eye exam?  Yes  Scheduled June 6th   Dental Screening: Recommended annual dental exams for proper oral hygiene  Community Resource Referral:  CRR required this visit?  No       Plan:  I have personally reviewed and addressed the Medicare Annual Wellness questionnaire and have noted the following in the patient's chart:  A. Medical and social history B. Use of alcohol, tobacco or illicit drugs  C. Current medications and supplements D. Functional ability and status E.  Nutritional status F.  Physical activity G. Advance directives H. List of other physicians I.  Hospitalizations, surgeries, and ER visits in previous 12 months J.  [place] such as hearing and vision if needed, cognitive and depression L. Referrals and appointments   In addition, I have reviewed and discussed with patient certain preventive protocols, quality metrics, and best practice recommendations. A written personalized care plan for preventive services as well as general preventive health recommendations were provided to patient.   Signed,    Bevelyn Ngo, LPN   [id number]  Nurse Health Advisor   Nurse Notes: none

## 2020-04-15 NOTE — Telephone Encounter (Signed)
PA denied.

## 2020-04-21 DIAGNOSIS — M1711 Unilateral primary osteoarthritis, right knee: Secondary | ICD-10-CM | POA: Diagnosis not present

## 2020-04-26 ENCOUNTER — Other Ambulatory Visit
Admission: RE | Admit: 2020-04-26 | Discharge: 2020-04-26 | Disposition: A | Discharge status: Home / Self Care | Payer: Medicare Other | Source: Ambulatory Visit | Attending: Gastroenterology | Admitting: Gastroenterology

## 2020-04-26 ENCOUNTER — Other Ambulatory Visit: Payer: Self-pay

## 2020-04-26 DIAGNOSIS — Z01812 Encounter for preprocedural laboratory examination: Secondary | ICD-10-CM | POA: Diagnosis not present

## 2020-04-26 DIAGNOSIS — Z20822 Contact with and (suspected) exposure to covid-19: Secondary | ICD-10-CM | POA: Diagnosis not present

## 2020-04-26 LAB — SARS CORONAVIRUS 2 (TAT 6-24 HRS): SARS Coronavirus 2: NEGATIVE

## 2020-04-27 ENCOUNTER — Telehealth: Payer: Self-pay

## 2020-04-27 MED ORDER — TADALAFIL 20 MG PO TABS: 10.0000 mg | ORAL_TABLET | ORAL | 11 refills | Status: DC | PRN

## 2020-04-27 NOTE — Telephone Encounter (Signed)
Can you print a hard copy of the tadalafil 20mg , patient is going to get it from [employer] [facility].

## 2020-04-28 ENCOUNTER — Ambulatory Visit: Payer: Medicare Other | Admitting: Anesthesiology

## 2020-04-28 ENCOUNTER — Encounter
Admission: RE | Disposition: A | Discharge status: Home / Self Care | Payer: Self-pay | Source: Ambulatory Visit | Attending: Gastroenterology

## 2020-04-28 ENCOUNTER — Other Ambulatory Visit: Payer: Self-pay

## 2020-04-28 ENCOUNTER — Ambulatory Visit
Admission: RE | Admit: 2020-04-28 | Discharge: 2020-04-28 | Disposition: A | Discharge status: Home / Self Care | Payer: Medicare Other | Source: Ambulatory Visit | Attending: Gastroenterology | Admitting: Gastroenterology

## 2020-04-28 DIAGNOSIS — E7800 Pure hypercholesterolemia, unspecified: Secondary | ICD-10-CM | POA: Insufficient documentation

## 2020-04-28 DIAGNOSIS — Z882 Allergy status to sulfonamides status: Secondary | ICD-10-CM | POA: Diagnosis not present

## 2020-04-28 DIAGNOSIS — Z7984 Long term (current) use of oral hypoglycemic drugs: Secondary | ICD-10-CM | POA: Diagnosis not present

## 2020-04-28 DIAGNOSIS — K5730 Diverticulosis of large intestine without perforation or abscess without bleeding: Secondary | ICD-10-CM | POA: Diagnosis not present

## 2020-04-28 DIAGNOSIS — Z8249 Family history of ischemic heart disease and other diseases of the circulatory system: Secondary | ICD-10-CM | POA: Insufficient documentation

## 2020-04-28 DIAGNOSIS — E119 Type 2 diabetes mellitus without complications: Secondary | ICD-10-CM | POA: Diagnosis not present

## 2020-04-28 DIAGNOSIS — Z87891 Personal history of nicotine dependence: Secondary | ICD-10-CM | POA: Insufficient documentation

## 2020-04-28 DIAGNOSIS — K644 Residual hemorrhoidal skin tags: Secondary | ICD-10-CM | POA: Diagnosis not present

## 2020-04-28 DIAGNOSIS — E785 Hyperlipidemia, unspecified: Secondary | ICD-10-CM | POA: Insufficient documentation

## 2020-04-28 DIAGNOSIS — Z7901 Long term (current) use of anticoagulants: Secondary | ICD-10-CM | POA: Insufficient documentation

## 2020-04-28 DIAGNOSIS — Z820 Family history of epilepsy and other diseases of the nervous system: Secondary | ICD-10-CM | POA: Insufficient documentation

## 2020-04-28 DIAGNOSIS — Z1211 Encounter for screening for malignant neoplasm of colon: Secondary | ICD-10-CM | POA: Diagnosis not present

## 2020-04-28 DIAGNOSIS — M1990 Unspecified osteoarthritis, unspecified site: Secondary | ICD-10-CM | POA: Diagnosis not present

## 2020-04-28 DIAGNOSIS — Z79899 Other long term (current) drug therapy: Secondary | ICD-10-CM | POA: Diagnosis not present

## 2020-04-28 HISTORY — PX: COLONOSCOPY WITH PROPOFOL: SHX5780

## 2020-04-28 LAB — HYPERCOAGULABLE PANEL, COMPREHENSIVE
APTT: 27.4 s
AT III Act/Nor PPP Chro: 132 %
Act. Prt C Resist w/FV Defic.: 3.1 ratio
Anticardiolipin Ab, IgG: <10 [GPL'U]
Anticardiolipin Ab, IgM: <10 [MPL'U]
Beta-2 Glycoprotein I, IgA: <10 SAU
Beta-2 Glycoprotein I, IgG: <10 SGU
Beta-2 Glycoprotein I, IgM: <10 SMU
DRVVT Confirm Seconds: 46.4 s
DRVVT Ratio: 1.2 ratio
DRVVT Screen Seconds: 61.1 s — ABNORMAL HIGH
Factor VII Antigen**: 76 %
Factor VIII Activity: 113 %
Hexagonal Phospholipid Neutral: 2 s
Homocysteine: 9.7 umol/L
Prot C Ag Act/Nor PPP Imm: 98 %
Prot S Ag Act/Nor PPP Imm: 84 %
Protein C Ag/FVII Ag Ratio**: 1.3 ratio
Protein S Ag/FVII Ag Ratio**: 1.1 ratio

## 2020-04-28 LAB — GLUCOSE, CAPILLARY: Glucose-Capillary: 148 mg/dL — ABNORMAL HIGH (ref 70–99)

## 2020-04-28 SURGERY — COLONOSCOPY WITH PROPOFOL
Anesthesia: General

## 2020-04-28 MED ORDER — PROPOFOL 500 MG/50ML IV EMUL
INTRAVENOUS | Status: AC
  Filled 2020-04-28: qty 50

## 2020-04-28 MED ORDER — PROPOFOL 10 MG/ML IV BOLUS
INTRAVENOUS | Status: DC | PRN
  Administered 2020-04-28: 90 mg via INTRAVENOUS

## 2020-04-28 MED ORDER — SODIUM CHLORIDE 0.9 % IV SOLN
INTRAVENOUS | Status: DC
  Administered 2020-04-28: 1000 mL via INTRAVENOUS

## 2020-04-28 MED ORDER — LIDOCAINE 2% (20 MG/ML) 5 ML SYRINGE
INTRAMUSCULAR | Status: DC | PRN
  Administered 2020-04-28: 50 mg via INTRAVENOUS

## 2020-04-28 MED ORDER — PROPOFOL 500 MG/50ML IV EMUL
INTRAVENOUS | Status: DC | PRN
  Administered 2020-04-28: 150 ug/kg/min via INTRAVENOUS

## 2020-04-28 NOTE — Anesthesia Preprocedure Evaluation (Signed)
Anesthesia Evaluation  Patient identified by MRN, date of birth, ID band Patient awake    Reviewed: Allergy & Precautions, NPO status , Patient's Chart, lab work & pertinent test results  History of Anesthesia Complications Negative for: history of anesthetic complications  Airway Mallampati: II  TM Distance: >3 FB Neck ROM: Full    Dental no notable dental hx.    Pulmonary neg sleep apnea, neg COPD, former smoker,    breath sounds clear to auscultation- rhonchi (-) wheezing      Cardiovascular Exercise Tolerance: Good (-) hypertension(-) CAD, (-) Past MI, (-) Cardiac Stents and (-) CABG  Rhythm:Regular Rate:Normal - Systolic murmurs and - Diastolic murmurs    Neuro/Psych neg Seizures negative neurological ROS  negative psych ROS   GI/Hepatic negative GI ROS, Neg liver ROS,   Endo/Other  diabetes, Oral Hypoglycemic Agents  Renal/GU negative Renal ROS     Musculoskeletal  (+) Arthritis ,   Abdominal (+) - obese,   Peds  Hematology negative hematology ROS (+)   Anesthesia Other Findings Past Medical History: 10/13/2019: Acute respiratory failure with hypoxia ([facility]) No date: Diabetes mellitus without complication ([facility]) No date: Hypercholesterolemia 01/23/2019: Osteoarthritis of knee No date: Type 2 diabetes mellitus with hyperlipidemia ([facility])   Reproductive/Obstetrics                             Anesthesia Physical Anesthesia Plan  ASA: II  Anesthesia Plan: General   Post-op Pain Management:    Induction: Intravenous  PONV Risk Score and Plan: 1 and Propofol infusion  Airway Management Planned: Natural Airway  Additional Equipment:   Intra-op Plan:   Post-operative Plan:   Informed Consent: I have reviewed the patients History and Physical, chart, labs and discussed the procedure including the risks, benefits and alternatives for the proposed anesthesia with the patient or  authorized representative who has indicated his/her understanding and acceptance.     Dental advisory given  Plan Discussed with: CRNA and Anesthesiologist  Anesthesia Plan Comments:         Anesthesia Quick Evaluation

## 2020-04-28 NOTE — Anesthesia Postprocedure Evaluation (Signed)
Anesthesia Post Note  Patient: Hunter Peters  Procedure(s) Performed: COLONOSCOPY WITH PROPOFOL (N/A )  Patient location during evaluation: Endoscopy Anesthesia Type: General Level of consciousness: awake and alert and oriented Pain management: pain level controlled Vital Signs Assessment: post-procedure vital signs reviewed and stable Respiratory status: spontaneous breathing, nonlabored ventilation and respiratory function stable Cardiovascular status: blood pressure returned to baseline and stable Postop Assessment: no signs of nausea or vomiting Anesthetic complications: no     Last Vitals:  Vitals:   04/28/20 0917 04/28/20 1014  BP: 104/62 96/63  Pulse: 66   Resp: 20   Temp: (!) 36.3 C (!) 35.9 C  SpO2: 100%     Last Pain:  Vitals:   04/28/20 1014  TempSrc: Temporal                 [name]

## 2020-04-28 NOTE — Transfer of Care (Signed)
Immediate Anesthesia Transfer of Care Note  Patient: Hunter Peters  Procedure(s) Performed: COLONOSCOPY WITH PROPOFOL (N/A )  Patient Location: Endoscopy Unit  Anesthesia Type:General  Level of Consciousness: awake  Airway & Oxygen Therapy: Patient Spontanous Breathing  Post-op Assessment: Post -op Vital signs reviewed and stable  Post vital signs: stable  Last Vitals:  Vitals Value Taken Time  BP 96/63 04/28/20 1015  Temp 35.9 C 04/28/20 1014  Pulse 64 04/28/20 1018  Resp 12 04/28/20 1018  SpO2 96 % 04/28/20 1018  Vitals shown include unvalidated device data.  Last Pain:  Vitals:   04/28/20 1014  TempSrc: Temporal         Complications: No apparent anesthesia complications

## 2020-04-28 NOTE — Op Note (Signed)
[facility] Patient Name: Hunter Peters Procedure Date: 04/28/2020 9:33 AM MRN: [id number] Account #: [id number] Date of Birth: 09-09-1951 Admit Type: Outpatient Age: 69 Room: [facility] ENDO ROOM 4 Gender: Male Note Status: Finalized Procedure:             Colonoscopy Indications:           Screening for colorectal malignant neoplasm, Last                         colonoscopy 10 years ago Providers:             Lin Landsman MD, MD Medicines:             Monitored Anesthesia Care Complications:         No immediate complications. Estimated blood loss: None. Procedure:             Pre-Anesthesia Assessment:                        - Prior to the procedure, a History and Physical was                         performed, and patient medications and allergies were                         reviewed. The patient is competent. The risks and                         benefits of the procedure and the sedation options and                         risks were discussed with the patient. All questions                         were answered and informed consent was obtained.                         Patient identification and proposed procedure were                         verified by the physician, the nurse, the                         anesthesiologist, the anesthetist and the technician                         in the pre-procedure area in the procedure room in the                         endoscopy suite. Mental Status Examination: alert and                         oriented. Airway Examination: normal oropharyngeal                         airway and neck mobility. Respiratory Examination:                         clear to auscultation. CV Examination: normal.  Prophylactic Antibiotics: The patient does not require                         prophylactic antibiotics. Prior Anticoagulants: The                         patient has taken no previous  anticoagulant or                         antiplatelet agents. ASA Grade Assessment: II - A                         patient with mild systemic disease. After reviewing                         the risks and benefits, the patient was deemed in                         satisfactory condition to undergo the procedure. The                         anesthesia plan was to use monitored anesthesia care                         (MAC). Immediately prior to administration of                         medications, the patient was re-assessed for adequacy                         to receive sedatives. The heart rate, respiratory                         rate, oxygen saturations, blood pressure, adequacy of                         pulmonary ventilation, and response to care were                         monitored throughout the procedure. The physical                         status of the patient was re-assessed after the                         procedure.                        After obtaining informed consent, the colonoscope was                         passed under direct vision. Throughout the procedure,                         the patient's blood pressure, pulse, and oxygen                         saturations were monitored continuously. The  Colonoscope was introduced through the anus and                         advanced to the the terminal ileum, with                         identification of the appendiceal orifice and IC                         valve. The colonoscopy was performed without                         difficulty. The patient tolerated the procedure well.                         The quality of the bowel preparation was evaluated                         using the BBPS [facility] Bowel Preparation Scale) with                         scores of: Right Colon = 3, Transverse Colon = 3 and                         Left Colon = 3 (entire mucosa seen well with no                          residual staining, small fragments of stool or opaque                         liquid). The total BBPS score equals 9. Findings:      Skin tags were found on perianal exam.      The terminal ileum appeared normal.      Multiple diverticula were found in the sigmoid colon.      Normal mucosa was found in the entire colon.      Anal papilla on retroflexion      Non-bleeding external hemorrhoids were found during retroflexion. The       hemorrhoids were medium-sized. Impression:            - Perianal skin tags found on perianal exam.                        - The examined portion of the ileum was normal.                        - Diverticulosis in the sigmoid colon.                        - Normal mucosa in the entire examined colon.                        - No specimens collected. Recommendation:        - Discharge patient to home (with escort).                        - Resume previous diet today.                        -  Continue present medications.                        - Repeat colonoscopy in 10 years for surveillance. Procedure Code(s):     --- Professional ---                        T0240, Colorectal cancer screening; colonoscopy on                         individual not meeting criteria for high risk Diagnosis Code(s):     --- Professional ---                        Z12.11, Encounter for screening for malignant neoplasm                         of colon                        K64.4, Residual hemorrhoidal skin tags                        K57.30, Diverticulosis of large intestine without                         perforation or abscess without bleeding CPT copyright 2019 American Medical Association. All rights reserved. The codes documented in this report are preliminary and upon coder review may  be revised to meet current compliance requirements. Dr. Ulyess Mort Lin Landsman MD, MD 04/28/2020 10:13:37 AM This report has been signed electronically. Number of Addenda:  0 Note Initiated On: 04/28/2020 9:33 AM Scope Withdrawal Time: 0 hours 11 minutes 11 seconds  Total Procedure Duration: 0 hours 14 minutes 12 seconds  Estimated Blood Loss:  Estimated blood loss: none.      [facility]

## 2020-04-28 NOTE — H&P (Signed)
Cephas Darby, MD [address]  [place]  [place], [place] [address]  Main: [phone]  Fax: [phone] Pager: [phone]  Primary Care Physician:  Valerie Roys, DO Primary Gastroenterologist:  Dr. Cephas Darby  Pre-Procedure History & Physical: HPI:  Hunter Peters is a 69 y.o. male is here for an colonoscopy.   Past Medical History:  Diagnosis Date  . Acute respiratory failure with hypoxia ([place]) 10/13/2019  . Diabetes mellitus without complication ([place])   . Hypercholesterolemia   . Osteoarthritis of knee 01/23/2019  . Type 2 diabetes mellitus with hyperlipidemia [facility])     Past Surgical History:  Procedure Laterality Date  . EYE SURGERY  5 years ago   lens  . Eyelid Surgery Bilateral   . INCISION AND DRAINAGE ABSCESS N/A 11/06/2019   Procedure: INCISION AND DRAINAGE ABSCESS-perianal abscess;  Surgeon: Jules Husbands, MD;  Location: [facility] ORS;  Service: General;  Laterality: N/A;  . INTRAOCULAR LENS INSERTION Left 7/11  . KNEE ARTHROSCOPY Right   . RECTAL EXAM UNDER ANESTHESIA N/A 11/06/2019   Procedure: RECTAL EXAM UNDER ANESTHESIA;  Surgeon: Jules Husbands, MD;  Location: [facility] ORS;  Service: General;  Laterality: N/A;    Prior to Admission medications   Medication Sig Start Date End Date Taking? Authorizing Provider  Ascorbic Acid (VITAMIN C) 500 MG CAPS Take 500 mg by mouth daily.    Yes [provider]  cholecalciferol (VITAMIN D3) 25 MCG (1000 UT) tablet Take 1,000 Units by mouth daily.   Yes [provider]  ELIQUIS 5 MG TABS tablet TAKE 2 TABLETS(10 MG) BY MOUTH TWICE DAILY FOR 6 DAYS THEN TAKE 1 TABLET(5 MG) BY MOUTH TWICE DAILY 03/28/20  Yes Johnson, Megan P, DO  lidocaine-prilocaine (EMLA) cream Apply 1 application topically as needed. Patient taking differently: Apply 1 application topically as needed (pain).  11/04/19  Yes [name], NP  losartan (COZAAR) 25 MG tablet Take 1 tablet (25 mg total) by mouth daily. 04/14/20 07/13/20  Yes Johnson, Megan P, DO  metFORMIN (GLUCOPHAGE) 500 MG tablet Take 2 tablets (1,000 mg total) by mouth 2 (two) times daily with a meal. 04/14/20  Yes Johnson, Megan P, DO  psyllium (METAMUCIL) 58.6 % packet Take 1 packet by mouth daily.   Yes [provider]  rosuvastatin (CRESTOR) 10 MG tablet Take 1 tablet (10 mg total) by mouth daily. 04/14/20  Yes Johnson, Megan P, DO  tadalafil (CIALIS) 20 MG tablet Take 0.5-1 tablets (10-20 mg total) by mouth every other day as needed for erectile dysfunction. 04/27/20  Yes Johnson, Megan P, DO    Allergies as of 03/24/2020 - Review Complete 03/24/2020  Allergen Reaction Noted  . Sulfonamide derivatives  05/11/2010    Family History  Problem Relation Age of Onset  . Alzheimer's disease Mother   . Hypertension Father     Social History   Socioeconomic History  . Marital status: Married    Spouse name: Not on file  . Number of children: Not on file  . Years of education: Not on file  . Highest education level: Not on file  Occupational History  . Not on file  Tobacco Use  . Smoking status: Former Smoker    Packs/day: 0.00    Years: 0.00    Pack years: 0.00    Types: Cigars  . Smokeless tobacco: Never Used  . Tobacco comment: occ.  Substance and Sexual Activity  . Alcohol use: Yes    Alcohol/week: 0.0  standard drinks    Comment: occasional  . Drug use: No  . Sexual activity: Yes    Birth control/protection: None  Other Topics Concern  . Not on file  Social History Narrative  . Not on file   Social Determinants of Health   Financial Resource Strain:   . Difficulty of Paying Living Expenses:   Food Insecurity:   . Worried About Charity fundraiser in the Last Year:   . Arboriculturist in the Last Year:   Transportation Needs:   . Film/video editor (Medical):   Marland Kitchen Lack of Transportation (Non-Medical):   Physical Activity:   . Days of Exercise per Week:   . Minutes of Exercise per Session:   Stress:   . Feeling  of Stress :   Social Connections:   . Frequency of Communication with Friends and Family:   . Frequency of Social Gatherings with Friends and Family:   . Attends Religious Services:   . Active Member of Clubs or Organizations:   . Attends Archivist Meetings:   Marland Kitchen Marital Status:   Intimate Partner Violence:   . Fear of Current or Ex-Partner:   . Emotionally Abused:   Marland Kitchen Physically Abused:   . Sexually Abused:     Review of Systems: See HPI, otherwise negative ROS  Physical Exam: BP 104/62   Pulse 66   Temp (!) 97.3 F (36.3 C) (Temporal)   Resp 20   Ht 5\' 10"  (1.778 m)   Wt 90.3 kg   SpO2 100%   BMI 28.55 kg/m  General:   Alert,  pleasant and cooperative in NAD Head:  Normocephalic and atraumatic. Neck:  Supple; no masses or thyromegaly. Lungs:  Clear throughout to auscultation.    Heart:  Regular rate and rhythm. Abdomen:  Soft, nontender and nondistended. Normal bowel sounds, without guarding, and without rebound.   Neurologic:  Alert and  oriented x4;  grossly normal neurologically.  Impression/Plan: Hunter Peters is here for an colonoscopy to be performed for colon cancer screening  Risks, benefits, limitations, and alternatives regarding  colonoscopy have been reviewed with the patient.  Questions have been answered.  All parties agreeable.   Sherri Sear, MD  04/28/2020, 9:31 AM

## 2020-04-29 ENCOUNTER — Encounter: Payer: Self-pay | Admitting: Gastroenterology

## 2020-05-03 LAB — HM DIABETES EYE EXAM

## 2020-05-11 DIAGNOSIS — L281 Prurigo nodularis: Secondary | ICD-10-CM | POA: Diagnosis not present

## 2020-06-04 DIAGNOSIS — M1711 Unilateral primary osteoarthritis, right knee: Secondary | ICD-10-CM | POA: Diagnosis not present

## 2020-06-15 DIAGNOSIS — K5792 Diverticulitis of intestine, part unspecified, without perforation or abscess without bleeding: Secondary | ICD-10-CM | POA: Diagnosis not present

## 2020-07-09 ENCOUNTER — Ambulatory Visit: Payer: Medicare Other | Admitting: Family

## 2020-07-12 ENCOUNTER — Telehealth: Payer: Self-pay | Admitting: Cardiovascular Disease

## 2020-07-12 DIAGNOSIS — M1711 Unilateral primary osteoarthritis, right knee: Secondary | ICD-10-CM | POA: Diagnosis not present

## 2020-07-12 NOTE — Telephone Encounter (Signed)
   [facility] Pre-operative Risk Assessment    HEARTCARE STAFF: - Please ensure there is not already an duplicate clearance open for this procedure. - Under Visit Info/Reason for Call, type in Other and utilize the format Clearance MM/DD/YY or Clearance TBD. Do not use dashes or single digits. - If request is for dental extraction, please clarify the # of teeth to be extracted.  Request for surgical clearance:  1. What type of surgery is being performed? Rt  Partial knee   2. When is this surgery scheduled? 09-09-20   3. What type of clearance is required (medical clearance vs. Pharmacy clearance to hold med vs. Both)? both  4. Are there any medications that need to be held prior to surgery and how long?please advise    5. Practice name and name of physician performing surgery? [facility] Dr. Harlow Mares    6. What is the office phone number? [phone] est [phone]   7.   What is the office fax number? [phone]  8.   Anesthesia type (None, local, MAC, general) ? Spinal / local    Clarisse Gouge 07/12/2020, 11:02 AM  _________________________________________________________________   (provider comments below)

## 2020-07-12 NOTE — Telephone Encounter (Signed)
The patient has an appointment with cardiology 9/20/201. His surgery is not until October 21.  Please Jule the 9/21 office visit as a pre op clearance visit.  Kerin Ransom PA-C 07/12/2020 12:03 PM

## 2020-08-09 ENCOUNTER — Other Ambulatory Visit: Payer: Self-pay

## 2020-08-09 ENCOUNTER — Encounter: Payer: Self-pay | Admitting: Family

## 2020-08-09 ENCOUNTER — Ambulatory Visit (INDEPENDENT_AMBULATORY_CARE_PROVIDER_SITE_OTHER): Payer: Medicare Other | Admitting: Family

## 2020-08-09 VITALS — BP 128/68 | HR 75 | Ht 70.0 in | Wt 215.8 lb

## 2020-08-09 DIAGNOSIS — I2510 Atherosclerotic heart disease of native coronary artery without angina pectoris: Secondary | ICD-10-CM

## 2020-08-09 DIAGNOSIS — Z01810 Encounter for preprocedural cardiovascular examination: Secondary | ICD-10-CM

## 2020-08-09 DIAGNOSIS — E785 Hyperlipidemia, unspecified: Secondary | ICD-10-CM | POA: Diagnosis not present

## 2020-08-09 DIAGNOSIS — E7800 Pure hypercholesterolemia, unspecified: Secondary | ICD-10-CM | POA: Diagnosis not present

## 2020-08-09 DIAGNOSIS — Z8249 Family history of ischemic heart disease and other diseases of the circulatory system: Secondary | ICD-10-CM | POA: Diagnosis not present

## 2020-08-09 MED ORDER — ASPIRIN EC 81 MG PO TBEC
81.0000 mg | DELAYED_RELEASE_TABLET | Freq: Every day | ORAL | Status: DC
Start: 2020-08-09 — End: 2020-10-18

## 2020-08-09 NOTE — Patient Instructions (Addendum)
Medication Instructions:  Your physician has recommended you make the following change in your medication:   1) Start aspirin 81 mg- take 1 tablet by mouth once daily   *If you need a refill on your cardiac medications before your next appointment, please call your pharmacy*  Lab Work: No lab work today. Your cholesterol numbers in May looked FANTASTIC!  Testing/Procedures: Your EKG today shows normal sinus rhythm.   Follow-Up: At [facility], you and your health needs are our priority.  As part of our continuing mission to provide you with exceptional heart care, we have created designated Provider Care Teams.  These Care Teams include your primary Cardiologist (physician) and Advanced Practice Providers (APPs -  Physician Assistants and Nurse Practitioners) who all work together to provide you with the care you need, when you need it.  We recommend signing up for the patient portal called "MyChart".  Sign up information is provided on this After Visit Summary.  MyChart is used to connect with patients for Virtual Visits (Telemedicine).  Patients are able to view lab/test results, encounter notes, upcoming appointments, etc.  Non-urgent messages can be sent to your provider as well.   To learn more about what you can do with MyChart, go to [email or web address].    Your next appointment:   1 year(s)  The format for your next appointment:   In Person  Provider:   You may see Ida Rogue, MD or one of the following Advanced Practice Providers on your designated Care Team:    Murray Hodgkins, NP  Christell Faith, PA-C  Marrianne Mood, PA-C  Cadence Kathlen Mody, [place]  Other Instructions  We have sent a message to your surgeon that you are cleared for your upcoming surgery.

## 2020-08-09 NOTE — Progress Notes (Signed)
Office Visit    Patient Name: Hunter Peters Date of Encounter: 08/09/2020  Primary Care Provider:  Valerie Roys, DO Primary Cardiologist:  Ida Rogue, MD Electrophysiologist:  None   Chief Complaint    Hunter Peters is a 69 y.o. male with a hx of HLD, DM 2, coronary artery calcification on CT presents today for follow-up of coronary artery calcification.  Past Medical History    Past Medical History:  Diagnosis Date  . Acute respiratory failure with hypoxia ([place]) 10/13/2019  . Diabetes mellitus without complication ([place])   . Hypercholesterolemia   . Osteoarthritis of knee 01/23/2019  . Type 2 diabetes mellitus with hyperlipidemia [facility])    Past Surgical History:  Procedure Laterality Date  . COLONOSCOPY WITH PROPOFOL N/A 04/28/2020   Procedure: COLONOSCOPY WITH PROPOFOL;  Surgeon: Lin Landsman, MD;  Location: [facility] ENDOSCOPY;  Service: Gastroenterology;  Laterality: N/A;  . EYE SURGERY  5 years ago   lens  . Eyelid Surgery Bilateral   . INCISION AND DRAINAGE ABSCESS N/A 11/06/2019   Procedure: INCISION AND DRAINAGE ABSCESS-perianal abscess;  Surgeon: Jules Husbands, MD;  Location: [facility] ORS;  Service: General;  Laterality: N/A;  . INTRAOCULAR LENS INSERTION Left 7/11  . KNEE ARTHROSCOPY Right   . RECTAL EXAM UNDER ANESTHESIA N/A 11/06/2019   Procedure: RECTAL EXAM UNDER ANESTHESIA;  Surgeon: Jules Husbands, MD;  Location: [facility] ORS;  Service: General;  Laterality: N/A;    Allergies  Allergies  Allergen Reactions  . Sulfonamide Derivatives     GI Intolerance    History of Present Illness    Hunter Peters is a 69 y.o. male with a hx of HLD, DM 2, coronary artery calcification on CT last seen 08/2019 by Dr. Rockey Situ.  Negative family history of cardiac disease.  He was seen in consult 08/2019 for cardiac screening.  He was very active at the time and without chest pain.  Subsequent coronary calcium score of 326 placing him in the 73rd percentile for age and  sex.  He did have Covid November 2020 requiring admission though feels he is recovered fully.  He has upcoming surgery 09/09/2020 for right partial knee with Dr. Harlow Mares of [facility].  Reports feeling overall well.  Enjoys golfing and his spare time as well as flying his plane.  He spends half the year in [place], [place] and half the year in [place].  Lives a very active lifestyle.  Endorses eating a low-sodium, heartily diet.  Reports no shortness of breath nor dyspnea on exertion. Reports no chest pain, pressure, or tightness. No edema, orthopnea, PND. Reports no palpitations.   Fortunately his knee pain does not limit his activity but does cause significant pain while sleeping.  EKGs/Labs/Other Studies Reviewed:   The following studies were reviewed today:  CT calcium scoring 08/2019 FINDINGS: Non-cardiac: See separate report from [facility].   Ascending aorta: Calcification noted in aortic arch   Pericardium: Calcifications noted in the pericardium   Coronary arteries: Normal origin   IMPRESSION: Coronary calcium score of 326. This was 57 percentile for age and sex matched control.   Calcifications noted in pericardium.  EKG:  EKG is ordered today.  The ekg ordered today demonstrates NSR 75 bpm with no acute ST/T wave changes.   Recent Labs: 10/12/2019: B Natriuretic Peptide 116.0 10/13/2019: Magnesium 2.9 04/09/2020: ALT 15; BUN 16; Creatinine, Ser 0.94; Hemoglobin 14.2; Platelets 316; Potassium 4.6; Sodium 142; TSH 1.640  Recent Lipid Panel  Component Value Date/Time   CHOL 115 04/09/2020 0906   TRIG 79 04/09/2020 0906   HDL 53 04/09/2020 0906   CHOLHDL 2.7 08/07/2019 1031   LDLCALC 46 04/09/2020 0906    Home Medications   Current Meds  Medication Sig  . Ascorbic Acid (VITAMIN C) 500 MG CAPS Take 500 mg by mouth daily.   . cholecalciferol (VITAMIN D3) 25 MCG (1000 UT) tablet Take 3,000 Units by mouth daily.   Marland Kitchen lidocaine-prilocaine (EMLA)  cream Apply 1 application topically as needed. (Patient taking differently: Apply 1 application topically as needed (pain). )  . metFORMIN (GLUCOPHAGE) 500 MG tablet Take 2 tablets (1,000 mg total) by mouth 2 (two) times daily with a meal.  . psyllium (METAMUCIL) 58.6 % packet Take 1 packet by mouth daily.  . rosuvastatin (CRESTOR) 10 MG tablet Take 1 tablet (10 mg total) by mouth daily.  . tadalafil (CIALIS) 20 MG tablet Take 0.5-1 tablets (10-20 mg total) by mouth every other day as needed for erectile dysfunction.    Review of Systems   Review of Systems  Constitutional: Negative for chills, fever and malaise/fatigue.  Cardiovascular: Negative for chest pain, dyspnea on exertion, leg swelling, near-syncope, orthopnea, palpitations and syncope.  Respiratory: Negative for cough, shortness of breath and wheezing.   Musculoskeletal: Positive for joint pain.  Gastrointestinal: Negative for nausea and vomiting.  Neurological: Negative for dizziness, light-headedness and weakness.   All other systems reviewed and are otherwise negative except as noted above.  Physical Exam    VS:  BP 128/68 (BP Location: Left Arm, Patient Position: Sitting, Cuff Size: Normal)   Pulse 75   Ht 5\' 10"  (1.778 m)   Wt 215 lb 12.8 oz (97.9 kg)   SpO2 98%   BMI 30.96 kg/m  , BMI Body mass index is 30.96 kg/m. GEN: Well nourished, well developed, in no acute distress. HEENT: normal. Neck: Supple, no JVD, carotid bruits, or masses. Cardiac: RRR, no murmurs, rubs, or gallops. No clubbing, cyanosis, edema.  Radials/DP/PT 2+ and equal bilaterally.  Respiratory:  Respirations regular and unlabored, clear to auscultation bilaterally. GI: Soft, nontender, nondistended, BS + x 4. MS: No deformity or atrophy. Skin: Warm and dry, no rash. Neuro:  Strength and sensation are intact. Psych: Normal affect.  Assessment & Plan    1. Preoperative cardiovascular clearance -upcoming knee surgery with Dr. Harlow Mares.According  to the Revised Cardiac Risk Index (RCRI), his Perioperative Risk of Major Cardiac Event is (%): 0.9. His Functional Capacity in METs is: 8.33 according to the [employer] Activity Status Index (DASI).  Based on AHA/ACC guidelines he is deemed acceptable risk for the planned procedure without additional cardiovascular testing.  He takes an aspirin 81 mg daily and may hold at  the direction of his surgeon prior to surgery.  Will route requesting party via epic fax function  2. DM2-follows with primary care provider.  10/14/2019 A1c 8.2.  3. HLD, LDL goal less than 70-5/29/2021 total cholesterol 115, HDL 53, LDL 46.  Normal liver function at that time.  Continue rosuvastatin 10 mg daily.  4. HTN - BP well controlled. Continue current antihypertensive regimen.   5. Coronary artery calcification on CT-EKG with no acute ST/T wave changes.  Reports no anginal symptoms.  No indication for ischemic evaluation this time.  Secondary prevention includes aspirin, statin.  No indication for beta-blocker at this time as his BP and heart rate are well controlled.  Continue regular cardiovascular exercise.  Continue low-sodium, heartily diet.  Disposition:  Follow up in 1 year(s) with Dr. Rockey Situ or APP  Loel Dubonnet, NP 08/09/2020, 5:03 PM

## 2020-08-13 DIAGNOSIS — L239 Allergic contact dermatitis, unspecified cause: Secondary | ICD-10-CM | POA: Diagnosis not present

## 2020-08-19 DIAGNOSIS — Z23 Encounter for immunization: Secondary | ICD-10-CM | POA: Diagnosis not present

## 2020-08-31 DIAGNOSIS — M25661 Stiffness of right knee, not elsewhere classified: Secondary | ICD-10-CM | POA: Diagnosis not present

## 2020-08-31 DIAGNOSIS — M25561 Pain in right knee: Secondary | ICD-10-CM | POA: Diagnosis not present

## 2020-09-22 DIAGNOSIS — I10 Essential (primary) hypertension: Secondary | ICD-10-CM | POA: Diagnosis not present

## 2020-09-22 DIAGNOSIS — Z01812 Encounter for preprocedural laboratory examination: Secondary | ICD-10-CM | POA: Diagnosis not present

## 2020-09-22 DIAGNOSIS — M1711 Unilateral primary osteoarthritis, right knee: Secondary | ICD-10-CM | POA: Diagnosis not present

## 2020-09-22 DIAGNOSIS — E119 Type 2 diabetes mellitus without complications: Secondary | ICD-10-CM | POA: Diagnosis not present

## 2020-09-22 DIAGNOSIS — E785 Hyperlipidemia, unspecified: Secondary | ICD-10-CM | POA: Diagnosis not present

## 2020-09-23 DIAGNOSIS — Z79899 Other long term (current) drug therapy: Secondary | ICD-10-CM | POA: Diagnosis not present

## 2020-09-23 DIAGNOSIS — G8918 Other acute postprocedural pain: Secondary | ICD-10-CM | POA: Diagnosis not present

## 2020-09-23 DIAGNOSIS — Z8616 Personal history of COVID-19: Secondary | ICD-10-CM | POA: Diagnosis not present

## 2020-09-23 DIAGNOSIS — Z8673 Personal history of transient ischemic attack (TIA), and cerebral infarction without residual deficits: Secondary | ICD-10-CM | POA: Diagnosis not present

## 2020-09-23 DIAGNOSIS — M25561 Pain in right knee: Secondary | ICD-10-CM | POA: Diagnosis not present

## 2020-09-23 DIAGNOSIS — E119 Type 2 diabetes mellitus without complications: Secondary | ICD-10-CM | POA: Diagnosis not present

## 2020-09-23 DIAGNOSIS — I2510 Atherosclerotic heart disease of native coronary artery without angina pectoris: Secondary | ICD-10-CM | POA: Diagnosis not present

## 2020-09-23 DIAGNOSIS — M25761 Osteophyte, right knee: Secondary | ICD-10-CM | POA: Diagnosis not present

## 2020-09-23 DIAGNOSIS — E785 Hyperlipidemia, unspecified: Secondary | ICD-10-CM | POA: Diagnosis not present

## 2020-09-23 DIAGNOSIS — Z791 Long term (current) use of non-steroidal anti-inflammatories (NSAID): Secondary | ICD-10-CM | POA: Diagnosis not present

## 2020-09-23 DIAGNOSIS — Z7984 Long term (current) use of oral hypoglycemic drugs: Secondary | ICD-10-CM | POA: Diagnosis not present

## 2020-09-23 DIAGNOSIS — Z882 Allergy status to sulfonamides status: Secondary | ICD-10-CM | POA: Diagnosis not present

## 2020-09-23 DIAGNOSIS — Z87891 Personal history of nicotine dependence: Secondary | ICD-10-CM | POA: Diagnosis not present

## 2020-09-23 DIAGNOSIS — M1711 Unilateral primary osteoarthritis, right knee: Secondary | ICD-10-CM | POA: Diagnosis not present

## 2020-09-23 DIAGNOSIS — I10 Essential (primary) hypertension: Secondary | ICD-10-CM | POA: Diagnosis not present

## 2020-09-24 DIAGNOSIS — E785 Hyperlipidemia, unspecified: Secondary | ICD-10-CM | POA: Diagnosis not present

## 2020-09-24 DIAGNOSIS — E119 Type 2 diabetes mellitus without complications: Secondary | ICD-10-CM | POA: Diagnosis not present

## 2020-09-24 DIAGNOSIS — I2510 Atherosclerotic heart disease of native coronary artery without angina pectoris: Secondary | ICD-10-CM | POA: Diagnosis not present

## 2020-09-24 DIAGNOSIS — M1711 Unilateral primary osteoarthritis, right knee: Secondary | ICD-10-CM | POA: Diagnosis not present

## 2020-09-24 DIAGNOSIS — M25761 Osteophyte, right knee: Secondary | ICD-10-CM | POA: Diagnosis not present

## 2020-09-24 DIAGNOSIS — I10 Essential (primary) hypertension: Secondary | ICD-10-CM | POA: Diagnosis not present

## 2020-09-30 DIAGNOSIS — M25561 Pain in right knee: Secondary | ICD-10-CM | POA: Diagnosis not present

## 2020-09-30 DIAGNOSIS — M25661 Stiffness of right knee, not elsewhere classified: Secondary | ICD-10-CM | POA: Diagnosis not present

## 2020-10-06 DIAGNOSIS — M25661 Stiffness of right knee, not elsewhere classified: Secondary | ICD-10-CM | POA: Diagnosis not present

## 2020-10-06 DIAGNOSIS — M25561 Pain in right knee: Secondary | ICD-10-CM | POA: Diagnosis not present

## 2020-10-08 DIAGNOSIS — M25661 Stiffness of right knee, not elsewhere classified: Secondary | ICD-10-CM | POA: Diagnosis not present

## 2020-10-08 DIAGNOSIS — M25561 Pain in right knee: Secondary | ICD-10-CM | POA: Diagnosis not present

## 2020-10-11 DIAGNOSIS — M25661 Stiffness of right knee, not elsewhere classified: Secondary | ICD-10-CM | POA: Diagnosis not present

## 2020-10-11 DIAGNOSIS — M25561 Pain in right knee: Secondary | ICD-10-CM | POA: Diagnosis not present

## 2020-10-13 DIAGNOSIS — M25561 Pain in right knee: Secondary | ICD-10-CM | POA: Diagnosis not present

## 2020-10-13 DIAGNOSIS — M25661 Stiffness of right knee, not elsewhere classified: Secondary | ICD-10-CM | POA: Diagnosis not present

## 2020-10-18 ENCOUNTER — Ambulatory Visit (INDEPENDENT_AMBULATORY_CARE_PROVIDER_SITE_OTHER): Payer: Medicare Other | Admitting: Family Medicine

## 2020-10-18 ENCOUNTER — Encounter: Payer: Self-pay | Admitting: Family Medicine

## 2020-10-18 ENCOUNTER — Other Ambulatory Visit: Payer: Self-pay

## 2020-10-18 VITALS — BP 108/71 | HR 90 | Temp 98.1°F | Ht 69.0 in | Wt 215.0 lb

## 2020-10-18 DIAGNOSIS — I2510 Atherosclerotic heart disease of native coronary artery without angina pectoris: Secondary | ICD-10-CM | POA: Diagnosis not present

## 2020-10-18 DIAGNOSIS — E7800 Pure hypercholesterolemia, unspecified: Secondary | ICD-10-CM | POA: Diagnosis not present

## 2020-10-18 DIAGNOSIS — E1169 Type 2 diabetes mellitus with other specified complication: Secondary | ICD-10-CM | POA: Diagnosis not present

## 2020-10-18 DIAGNOSIS — E785 Hyperlipidemia, unspecified: Secondary | ICD-10-CM

## 2020-10-18 LAB — MICROALBUMIN, URINE WAIVED
Creatinine, Urine Waived: 200 mg/dL (ref 10–300)
Microalb, Ur Waived: 30 mg/L — ABNORMAL HIGH (ref 0–19)
Microalb/Creat Ratio: <30 mg/g (ref <30)

## 2020-10-18 LAB — BAYER DCA HB A1C WAIVED: HB A1C (BAYER DCA - WAIVED): 6.2 % (ref <7.0)

## 2020-10-18 MED ORDER — LOSARTAN POTASSIUM 25 MG PO TABS: 25.0000 mg | ORAL_TABLET | Freq: Every day | ORAL | 1 refills | Status: DC

## 2020-10-18 MED ORDER — METFORMIN HCL 500 MG PO TABS: 1000.0000 mg | ORAL_TABLET | Freq: Two times a day (BID) | ORAL | 1 refills | Status: DC

## 2020-10-18 MED ORDER — TADALAFIL 20 MG PO TABS: 10.0000 mg | ORAL_TABLET | ORAL | 11 refills | Status: DC | PRN

## 2020-10-18 MED ORDER — ROSUVASTATIN CALCIUM 10 MG PO TABS: 10.0000 mg | ORAL_TABLET | Freq: Every day | ORAL | 1 refills | Status: DC

## 2020-10-18 NOTE — Progress Notes (Signed)
BP 108/71   Pulse 90   Temp 98.1 F (36.7 C) (Oral)   Ht 5\' 9"  (1.753 m)   Wt 215 lb (97.5 kg)   SpO2 97%   BMI 31.75 kg/m    Subjective:    Patient ID: Hunter Peters, male    DOB: Apr 01, 1951, 69 y.o.   MRN: [id number]  HPI: Hunter Peters is a 69 y.o. male  Chief Complaint  Patient presents with  . Diabetes  . Hyperlipidemia   DIABETES Hypoglycemic episodes:no Polydipsia/polyuria: yes Visual disturbance: no Chest pain: no Paresthesias: no Glucose Monitoring: no  Accucheck frequency: Not Checking Taking Insulin?: no Blood Pressure Monitoring: not checking Retinal Examination: Up to Date Foot Exam: Up to Date Diabetic Education: Completed Pneumovax: Up to Date Influenza: Up to Date Aspirin: no  HYPERLIPIDEMIA Hyperlipidemia status: excellent compliance Satisfied with current treatment?  no Side effects:  no Medication compliance: excellent compliance Past cholesterol meds: crestor Supplements: fish oil Aspirin:  no The ASCVD Risk score [name], et al., 2013) failed to calculate for the following reasons:   The valid total cholesterol range is 130 to 320 mg/dL Chest pain:  no Coronary artery disease:  no  Relevant past medical, surgical, family and social history reviewed and updated as indicated. Interim medical history since our last visit reviewed. Allergies and medications reviewed and updated.  Review of Systems  Constitutional: Negative.   HENT: Negative.   Respiratory: Negative.   Cardiovascular: Negative.   Gastrointestinal: Negative.   Musculoskeletal: Positive for arthralgias and gait problem. Negative for back pain, joint swelling, myalgias, neck pain and neck stiffness.  Skin: Negative.   Hematological: Negative.   Psychiatric/Behavioral: Negative.     Per HPI unless specifically indicated above     Objective:    BP 108/71   Pulse 90   Temp 98.1 F (36.7 C) (Oral)   Ht 5\' 9"  (1.753 m)   Wt 215 lb (97.5 kg)   SpO2 97%   BMI  31.75 kg/m   Wt Readings from Last 3 Encounters:  10/18/20 215 lb (97.5 kg)  08/09/20 215 lb 12.8 oz (97.9 kg)  04/28/20 199 lb (90.3 kg)    Physical Exam Vitals and nursing note reviewed.  Constitutional:      General: He is not in acute distress.    Appearance: Normal appearance. He is not ill-appearing, toxic-appearing or diaphoretic.  HENT:     Head: Normocephalic and atraumatic.     Right Ear: External ear normal.     Left Ear: External ear normal.     Nose: Nose normal.     Mouth/Throat:     Mouth: Mucous membranes are moist.     Pharynx: Oropharynx is clear.  Eyes:     General: No scleral icterus.       Right eye: No discharge.        Left eye: No discharge.     Extraocular Movements: Extraocular movements intact.     Conjunctiva/sclera: Conjunctivae normal.     Pupils: Pupils are equal, round, and reactive to light.  Cardiovascular:     Rate and Rhythm: Normal rate and regular rhythm.     Pulses: Normal pulses.     Heart sounds: Normal heart sounds. No murmur heard.  No friction rub. No gallop.   Pulmonary:     Effort: Pulmonary effort is normal. No respiratory distress.     Breath sounds: Normal breath sounds. No stridor. No wheezing, rhonchi or rales.  Chest:  Chest wall: No tenderness.  Musculoskeletal:        General: Normal range of motion.     Cervical back: Normal range of motion and neck supple.  Skin:    General: Skin is warm and dry.     Capillary Refill: Capillary refill takes less than 2 seconds.     Coloration: Skin is not jaundiced or pale.     Findings: No bruising, erythema, lesion or rash.  Neurological:     General: No focal deficit present.     Mental Status: He is alert and oriented to person, place, and time. Mental status is at baseline.  Psychiatric:        Mood and Affect: Mood normal.        Behavior: Behavior normal.        Thought Content: Thought content normal.        Judgment: Judgment normal.     Results for orders  placed or performed during the hospital encounter of 04/28/20  Glucose, capillary  Result Value Ref Range   Glucose-Capillary 148 (H) 70 - 99 mg/dL   Comment 1 IN EPIC       Assessment & Plan:   Problem List Items Addressed This Visit      Endocrine   Type 2 diabetes mellitus with hyperlipidemia ([place]) - Primary    Doing well with A1c of 6.2. Continue metformin. Continue diet and exercise. Call with any concerns. Refills given today. Follow up 6 months.       Relevant Medications   metFORMIN (GLUCOPHAGE) 500 MG tablet   rosuvastatin (CRESTOR) 10 MG tablet   losartan (COZAAR) 25 MG tablet   tadalafil (CIALIS) 20 MG tablet   Other Relevant Orders   Bayer DCA Hb A1c Waived   Comprehensive metabolic panel   Microalbumin, Urine Waived     Other   Hypercholesterolemia    Under good control on current regimen. Continue current regimen. Continue to monitor. Call with any concerns. Refills given. Labs drawn today.       Relevant Medications   rosuvastatin (CRESTOR) 10 MG tablet   losartan (COZAAR) 25 MG tablet   tadalafil (CIALIS) 20 MG tablet   Other Relevant Orders   Comprehensive metabolic panel   Lipid Panel w/o Chol/HDL Ratio       Follow up plan: Return in about 6 months (around 04/17/2021).

## 2020-10-18 NOTE — Patient Instructions (Addendum)
Nose Retraining  Smell retraining therapy (SRT) is a treatment for loss of smell, also referred to as hyposmia or anosmia. It can be used to help return your sense of smell if it was lost during a viral infection or minor head trauma. SRT was originally developed in 2009 by Dr. Vivien Rota at the [facility].  What Is Involved with SRT? The process of SRT involves the repeated presentation of different smells through the nose to stimulate the olfactory system and establish memory of that smell. It is best to start with at least four different scents, especially smells you remember. The most recommended fragrances are rose (floral), lemon (fruity), cloves (spicy), and eucalyptus (resinous). Take sniffs of each scent for 10 to 20 seconds at least once or twice a day. While sniffing, it is important to be focused on the task. Try to concentrate on your memory of that smell. After each scent, take a few breaths and then move on to the next fragrance. It is recommended that you do this for at least 12 weeks (three months), but you can do it longer, alternating the scents if you like.  SRT is believed to work as a combination of the unique ability for smell nerves to regrow while encouraging improved brain connectivity. Either way, try not to get discouraged; it is common for this process to take some time before you start to smell anything, and that is okay.  Why These Four Scents? Most people can identify the five different tastes that humans are capable of detecting: salty, sweet, bitter, sour, and savory (umami). Some researchers have also tried to categorize the many different smells as well. These categories include floral, fruity, spicy, resinous, burnt and foul. Instead of having to practice smelling burnt or foul odors, SRT concentrates on the more pleasant smells from the other four categories.  Can SRT Help If I Have Had COVID-19 Symptoms? Most of the studies on SRT have been done on  patients with post-viral (i.e., after a cold or upper respiratory infection) smell loss. Research findings on SRT for COVID-19-related smell loss are not yet available. It seems that most people get their sense of smell back within several months after COVID-19. If you are not in that group, it may be beneficial to consider trying SRT. Even if it is not helpful, it will not worsen the problem.  Where Can I Get the Fragrances? Many people prefer to use essential oils. These can be purchased online or from local health food, aromatherapy, or craft stores. These can be the most convenient because they have a long shelf-life, but you can also use a variety of other options including inhalant sticks or fresh fruits and herbs. When using the oils, you can place a small amount into a glass jar with a lid. Remember to close the lid tightly after each use to preserve the smell into the jar.  Does My Doctor Need to Be Involved? A study conducted by researchers at [employer] showed that SRT worked better when paired with sinus rinses that included steroids1. Consult an ENT (ear, nose, and throat) specialist, or otolaryngologist, to see whether this treatment may be a better option for you.   1 Emelia Salisbury, Patel ZM. Budesonide irrigation with olfactory training improves outcomes compared with olfactory training alone in patients with olfactory loss. Int Forum Allergy Rhinol. 2018 Sep;8(9):977-981.

## 2020-10-18 NOTE — Assessment & Plan Note (Signed)
Doing well with A1c of 6.2. Continue metformin. Continue diet and exercise. Call with any concerns. Refills given today. Follow up 6 months.

## 2020-10-18 NOTE — Assessment & Plan Note (Signed)
Under good control on current regimen. Continue current regimen. Continue to monitor. Call with any concerns. Refills given. Labs drawn today.   

## 2020-10-19 DIAGNOSIS — M25661 Stiffness of right knee, not elsewhere classified: Secondary | ICD-10-CM | POA: Diagnosis not present

## 2020-10-19 DIAGNOSIS — M25561 Pain in right knee: Secondary | ICD-10-CM | POA: Diagnosis not present

## 2020-10-19 LAB — COMPREHENSIVE METABOLIC PANEL
ALT: 14 IU/L (ref 0–44)
AST: 11 IU/L (ref 0–40)
Albumin/Globulin Ratio: 1.9 (ref 1.2–2.2)
Albumin: 4.8 g/dL (ref 3.8–4.8)
Alkaline Phosphatase: 54 IU/L (ref 44–121)
BUN/Creatinine Ratio: 17 (ref 10–24)
BUN: 16 mg/dL (ref 8–27)
Bilirubin Total: 0.7 mg/dL (ref 0.0–1.2)
CO2: 22 mmol/L (ref 20–29)
Calcium: 9.8 mg/dL (ref 8.6–10.2)
Chloride: 102 mmol/L (ref 96–106)
Creatinine, Ser: 0.93 mg/dL (ref 0.76–1.27)
GFR calc Af Amer: 98 mL/min/{1.73_m2} (ref >59)
GFR calc non Af Amer: 85 mL/min/{1.73_m2} (ref >59)
Globulin, Total: 2.5 g/dL (ref 1.5–4.5)
Glucose: 108 mg/dL — ABNORMAL HIGH (ref 65–99)
Potassium: 4.8 mmol/L (ref 3.5–5.2)
Sodium: 143 mmol/L (ref 134–144)
Total Protein: 7.3 g/dL (ref 6.0–8.5)

## 2020-10-19 LAB — LIPID PANEL W/O CHOL/HDL RATIO
Cholesterol, Total: 144 mg/dL (ref 100–199)
HDL: 56 mg/dL (ref >39)
LDL Chol Calc (NIH): 56 mg/dL (ref 0–99)
Triglycerides: 199 mg/dL — ABNORMAL HIGH (ref 0–149)
VLDL Cholesterol Cal: 32 mg/dL (ref 5–40)

## 2020-10-21 DIAGNOSIS — M25561 Pain in right knee: Secondary | ICD-10-CM | POA: Diagnosis not present

## 2020-10-21 DIAGNOSIS — Z23 Encounter for immunization: Secondary | ICD-10-CM | POA: Diagnosis not present

## 2020-10-21 DIAGNOSIS — M25661 Stiffness of right knee, not elsewhere classified: Secondary | ICD-10-CM | POA: Diagnosis not present

## 2020-10-25 DIAGNOSIS — M25561 Pain in right knee: Secondary | ICD-10-CM | POA: Diagnosis not present

## 2020-10-25 DIAGNOSIS — M25661 Stiffness of right knee, not elsewhere classified: Secondary | ICD-10-CM | POA: Diagnosis not present

## 2020-10-27 DIAGNOSIS — M25661 Stiffness of right knee, not elsewhere classified: Secondary | ICD-10-CM | POA: Diagnosis not present

## 2020-10-27 DIAGNOSIS — M25561 Pain in right knee: Secondary | ICD-10-CM | POA: Diagnosis not present

## 2020-10-28 ENCOUNTER — Other Ambulatory Visit: Payer: Self-pay | Admitting: Family Medicine

## 2020-11-01 DIAGNOSIS — M25561 Pain in right knee: Secondary | ICD-10-CM | POA: Diagnosis not present

## 2020-11-01 DIAGNOSIS — M25661 Stiffness of right knee, not elsewhere classified: Secondary | ICD-10-CM | POA: Diagnosis not present

## 2020-11-02 DIAGNOSIS — Z96653 Presence of artificial knee joint, bilateral: Secondary | ICD-10-CM | POA: Diagnosis not present

## 2020-11-02 DIAGNOSIS — Z471 Aftercare following joint replacement surgery: Secondary | ICD-10-CM | POA: Diagnosis not present

## 2020-11-03 DIAGNOSIS — M25661 Stiffness of right knee, not elsewhere classified: Secondary | ICD-10-CM | POA: Diagnosis not present

## 2020-11-03 DIAGNOSIS — M25561 Pain in right knee: Secondary | ICD-10-CM | POA: Diagnosis not present

## 2020-11-08 DIAGNOSIS — M25561 Pain in right knee: Secondary | ICD-10-CM | POA: Diagnosis not present

## 2020-11-08 DIAGNOSIS — M25661 Stiffness of right knee, not elsewhere classified: Secondary | ICD-10-CM | POA: Diagnosis not present

## 2020-11-10 DIAGNOSIS — M25661 Stiffness of right knee, not elsewhere classified: Secondary | ICD-10-CM | POA: Diagnosis not present

## 2020-11-10 DIAGNOSIS — M25561 Pain in right knee: Secondary | ICD-10-CM | POA: Diagnosis not present

## 2020-11-15 DIAGNOSIS — M25561 Pain in right knee: Secondary | ICD-10-CM | POA: Diagnosis not present

## 2020-11-15 DIAGNOSIS — M25661 Stiffness of right knee, not elsewhere classified: Secondary | ICD-10-CM | POA: Diagnosis not present

## 2020-11-17 DIAGNOSIS — M25661 Stiffness of right knee, not elsewhere classified: Secondary | ICD-10-CM | POA: Diagnosis not present

## 2020-11-17 DIAGNOSIS — M25561 Pain in right knee: Secondary | ICD-10-CM | POA: Diagnosis not present

## 2020-12-16 ENCOUNTER — Telehealth: Payer: Self-pay | Admitting: *Deleted

## 2020-12-16 NOTE — Chronic Care Management (AMB) (Signed)
  Chronic Care Management   Note  12/16/2020 Name: [name] MRN: [id number] DOB: [birth year]  Hunter Peters is a 70 y.o. year old male who is a primary care patient of Valerie Roys, DO. I reached out to Hunter Peters by phone today in response to a referral sent by Mr. Hunter Peters's health plan.     Hunter Peters was given information about Chronic Care Management services today including:  1. CCM service includes personalized support from designated clinical staff supervised by his physician, including individualized plan of care and coordination with other care providers 2. 24/7 contact phone numbers for assistance for urgent and routine care needs. 3. Service will only be billed when office clinical staff spend 20 minutes or more in a month to coordinate care. 4. Only one practitioner may furnish and bill the service in a calendar month. 5. The patient may stop CCM services at any time (effective at the end of the month) by phone call to the office staff. 6. The patient will be responsible for cost sharing (co-pay) of up to 20% of the service fee (after annual deductible is met).  Patient agreed to services and verbal consent obtained.   Follow up plan: Telephone appointment with care management team member scheduled for:12/29/2020  Kobie Whidby  Care Guide, Embedded Care Coordination [place]  Care Management

## 2020-12-20 ENCOUNTER — Telehealth: Payer: Self-pay | Admitting: *Deleted

## 2020-12-20 NOTE — Chronic Care Management (AMB) (Signed)
  Care Management   Note  12/20/2020 Name: Hunter Peters MRN: [id number] DOB: 03-20-1951  Hunter Peters is a 70 y.o. year old male who is a primary care patient of Valerie Roys, DO and is actively engaged with the care management team. I reached out to Hunter Peters by phone today to assist with re-scheduling an initial visit with the Pharmacist  Follow up plan: Unsuccessful telephone outreach attempt made. A HIPAA compliant phone message was left for the patient providing contact information and requesting a return call.  The care management team will reach out to the patient again over the next 7 days.  If patient returns call to provider office, please advise to call [place] at [phone].  [facility]

## 2020-12-29 ENCOUNTER — Telehealth: Payer: Medicare Other

## 2020-12-30 NOTE — Chronic Care Management (AMB) (Signed)
  Chronic Care Management   Outreach Note  12/30/2020 Name: Hunter Peters MRN: [id number] DOB: 1951/02/04  Hunter Peters is a 70 y.o. year old male who is a primary care patient of Valerie Roys, DO. I reached out to Hunter Peters by phone today in response to a referral sent by [name] health plan.     A second unsuccessful telephone outreach was attempted today. The patient was referred to the case management team for assistance with care management and care coordination.   Follow Up Plan: A HIPAA compliant phone message was left for the patient providing contact information and requesting a return call. The care management team will reach out to the patient again over the next 7 days. If patient returns call to provider office, please advise to call [place] at [phone].  [employer], [place] [facility]  [place], [place] [address] Direct Dial: [email or web address] Website: [email or web address]

## 2021-01-11 NOTE — Progress Notes (Signed)
Patient is eligible for CCM services and the embedded care coordination team assigned to your practice and patients is available to provide assistance. Reached out to provider for referral.    [facility]

## 2021-04-11 ENCOUNTER — Ambulatory Visit (INDEPENDENT_AMBULATORY_CARE_PROVIDER_SITE_OTHER): Payer: Medicare Other | Admitting: Family Medicine

## 2021-04-11 ENCOUNTER — Encounter: Payer: Self-pay | Admitting: Family Medicine

## 2021-04-11 ENCOUNTER — Other Ambulatory Visit: Payer: Self-pay

## 2021-04-11 VITALS — BP 107/65 | HR 75 | Temp 97.8°F | Wt 220.0 lb

## 2021-04-11 DIAGNOSIS — R3911 Hesitancy of micturition: Secondary | ICD-10-CM | POA: Diagnosis not present

## 2021-04-11 DIAGNOSIS — E1169 Type 2 diabetes mellitus with other specified complication: Secondary | ICD-10-CM | POA: Diagnosis not present

## 2021-04-11 DIAGNOSIS — E785 Hyperlipidemia, unspecified: Secondary | ICD-10-CM

## 2021-04-11 DIAGNOSIS — E7800 Pure hypercholesterolemia, unspecified: Secondary | ICD-10-CM

## 2021-04-11 DIAGNOSIS — Z8719 Personal history of other diseases of the digestive system: Secondary | ICD-10-CM

## 2021-04-11 LAB — URINALYSIS, ROUTINE W REFLEX MICROSCOPIC
Bilirubin, UA: NEGATIVE
Glucose, UA: NEGATIVE
Ketones, UA: NEGATIVE
Leukocytes,UA: NEGATIVE
Nitrite, UA: NEGATIVE
Protein,UA: NEGATIVE
RBC, UA: NEGATIVE
Specific Gravity, UA: 1.02 (ref 1.005–1.030)
Urobilinogen, Ur: 0.2 mg/dL (ref 0.2–1.0)
pH, UA: 5.5 (ref 5.0–7.5)

## 2021-04-11 LAB — MICROALBUMIN, URINE WAIVED
Creatinine, Urine Waived: 300 mg/dL (ref 10–300)
Microalb, Ur Waived: 10 mg/L (ref 0–19)
Microalb/Creat Ratio: <30 mg/g (ref <30)

## 2021-04-11 LAB — BAYER DCA HB A1C WAIVED: HB A1C (BAYER DCA - WAIVED): 7.2 % — ABNORMAL HIGH (ref <7.0)

## 2021-04-11 MED ORDER — TADALAFIL 20 MG PO TABS: 10.0000 mg | ORAL_TABLET | ORAL | 11 refills | Status: AC | PRN

## 2021-04-11 MED ORDER — LOSARTAN POTASSIUM 25 MG PO TABS: 25.0000 mg | ORAL_TABLET | Freq: Every day | ORAL | 1 refills | Status: DC

## 2021-04-11 MED ORDER — ROSUVASTATIN CALCIUM 10 MG PO TABS: 10.0000 mg | ORAL_TABLET | Freq: Every day | ORAL | 1 refills | Status: AC

## 2021-04-11 MED ORDER — METFORMIN HCL 500 MG PO TABS: 1000.0000 mg | ORAL_TABLET | Freq: Two times a day (BID) | ORAL | 1 refills | Status: AC

## 2021-04-11 NOTE — Assessment & Plan Note (Signed)
Up with A1c of 7.2- will work on diet and exercise and recheck 6 months. Call with any concerns. Refills given.

## 2021-04-11 NOTE — Progress Notes (Signed)
BP 107/65   Pulse 75   Temp 97.8 F (36.6 C)   Wt 220 lb (99.8 kg)   SpO2 98%   BMI 32.49 kg/m    Subjective:    Patient ID: Hunter Peters, male    DOB: 10/13/1951, 70 y.o.   MRN: [id number]  HPI: Hunter Peters is a 70 y.o. male  Chief Complaint  Patient presents with  . Diabetes  . Hyperlipidemia    Patient would like 90 day supply of medication [place].    HYPERLIPIDEMIA Hyperlipidemia status: excellent compliance Satisfied with current treatment?  yes Side effects:  no Medication compliance: excellent compliance Past cholesterol meds: crestor Supplements: none Aspirin:  no The 10-year ASCVD risk score [name], et al., 2013) is: 24.6%   Values used to calculate the score:     Age: 47 years     Sex: Male     Is Non-Hispanic African American: No     Diabetic: Yes     Tobacco smoker: Yes     Systolic Blood Pressure: 384 mmHg     Is BP treated: Yes     HDL Cholesterol: 56 mg/dL     Total Cholesterol: 144 mg/dL Chest pain:  no  DIABETES Hypoglycemic episodes:no Polydipsia/polyuria: no Visual disturbance: no Chest pain: no Paresthesias: no Glucose Monitoring: yes  Accucheck frequency: Not Checking Taking Insulin?: no Blood Pressure Monitoring: not checking Retinal Examination: Up to Date Foot Exam: Up to Date Diabetic Education: Completed Pneumovax: Up to Date Influenza: Up to Date Aspirin: yes  Relevant past medical, surgical, family and social history reviewed and updated as indicated. Interim medical history since our last visit reviewed. Allergies and medications reviewed and updated.  Review of Systems  Constitutional: Negative.   Respiratory: Negative.   Cardiovascular: Negative.   Gastrointestinal: Negative.   Musculoskeletal: Negative.   Psychiatric/Behavioral: Negative.     Per HPI unless specifically indicated above     Objective:    BP 107/65   Pulse 75   Temp 97.8 F (36.6 C)   Wt 220 lb  (99.8 kg)   SpO2 98%   BMI 32.49 kg/m   Wt Readings from Last 3 Encounters:  04/11/21 220 lb (99.8 kg)  10/18/20 215 lb (97.5 kg)  08/09/20 215 lb 12.8 oz (97.9 kg)    Physical Exam Vitals and nursing note reviewed.  Constitutional:      General: He is not in acute distress.    Appearance: Normal appearance. He is not ill-appearing, toxic-appearing or diaphoretic.  HENT:     Head: Normocephalic and atraumatic.     Right Ear: External ear normal.     Left Ear: External ear normal.     Nose: Nose normal.     Mouth/Throat:     Mouth: Mucous membranes are moist.     Pharynx: Oropharynx is clear.  Eyes:     General: No scleral icterus.       Right eye: No discharge.        Left eye: No discharge.     Extraocular Movements: Extraocular movements intact.     Conjunctiva/sclera: Conjunctivae normal.     Pupils: Pupils are equal, round, and reactive to light.  Cardiovascular:     Rate and Rhythm: Normal rate and regular rhythm.     Pulses: Normal pulses.     Heart sounds: Normal heart sounds. No murmur heard. No friction rub. No gallop.  Pulmonary:     Effort: Pulmonary effort is normal. No respiratory distress.     Breath sounds: Normal breath sounds. No stridor. No wheezing, rhonchi or rales.  Chest:     Chest wall: No tenderness.  Musculoskeletal:        General: Normal range of motion.     Cervical back: Normal range of motion and neck supple.  Skin:    General: Skin is warm and dry.     Capillary Refill: Capillary refill takes less than 2 seconds.     Coloration: Skin is not jaundiced or pale.     Findings: No bruising, erythema, lesion or rash.  Neurological:     General: No focal deficit present.     Mental Status: He is alert and oriented to person, place, and time. Mental status is at baseline.  Psychiatric:        Mood and Affect: Mood normal.        Behavior: Behavior normal.        Thought Content: Thought content normal.        Judgment: Judgment normal.      Results for orders placed or performed in visit on 10/18/20  Bayer DCA Hb A1c Waived  Result Value Ref Range   HB A1C (BAYER DCA - WAIVED) 6.2 <7.0 %  Comprehensive metabolic panel  Result Value Ref Range   Glucose 108 (H) 65 - 99 mg/dL   BUN 16 8 - 27 mg/dL   Creatinine, Ser 0.93 0.76 - 1.27 mg/dL   GFR calc non Af Amer 85 >59 mL/min/1.73   GFR calc Af Amer 98 >59 mL/min/1.73   BUN/Creatinine Ratio 17 10 - 24   Sodium 143 134 - 144 mmol/L   Potassium 4.8 3.5 - 5.2 mmol/L   Chloride 102 96 - 106 mmol/L   CO2 22 20 - 29 mmol/L   Calcium 9.8 8.6 - 10.2 mg/dL   Total Protein 7.3 6.0 - 8.5 g/dL   Albumin 4.8 3.8 - 4.8 g/dL   Globulin, Total 2.5 1.5 - 4.5 g/dL   Albumin/Globulin Ratio 1.9 1.2 - 2.2   Bilirubin Total 0.7 0.0 - 1.2 mg/dL   Alkaline Phosphatase 54 44 - 121 IU/L   AST 11 0 - 40 IU/L   ALT 14 0 - 44 IU/L  Lipid Panel w/o Chol/HDL Ratio  Result Value Ref Range   Cholesterol, Total 144 100 - 199 mg/dL   Triglycerides 199 (H) 0 - 149 mg/dL   HDL 56 >39 mg/dL   VLDL Cholesterol Cal 32 5 - 40 mg/dL   LDL Chol Calc (NIH) 56 0 - 99 mg/dL  Microalbumin, Urine Waived  Result Value Ref Range   Microalb, Ur Waived 30 (H) 0 - 19 mg/L   Creatinine, Urine Waived 200 10 - 300 mg/dL   Microalb/Creat Ratio <30 <30 mg/g      Assessment & Plan:   Problem List Items Addressed This Visit      Endocrine   Type 2 diabetes mellitus with hyperlipidemia ([place]) - Primary    Up with A1c of 7.2- will work on diet and exercise and recheck 6 months. Call with any concerns. Refills given.       Relevant Medications   losartan (COZAAR) 25 MG tablet   metFORMIN (GLUCOPHAGE) 500 MG tablet   rosuvastatin (CRESTOR) 10 MG tablet   tadalafil (CIALIS) 20 MG tablet   Other Relevant Orders   Bayer DCA Hb A1c Waived   CBC with Differential/Platelet  Microalbumin, Urine Waived   Urinalysis, Routine w reflex microscopic   TSH     Other   Hypercholesterolemia    Under good control on  current regimen. Continue current regimen. Continue to monitor. Call with any concerns. Refills given. Labs drawn today.       Relevant Medications   losartan (COZAAR) 25 MG tablet   rosuvastatin (CRESTOR) 10 MG tablet   tadalafil (CIALIS) 20 MG tablet   Other Relevant Orders   CBC with Differential/Platelet   Comprehensive metabolic panel   Lipid Panel w/o Chol/HDL Ratio   History of anal fissures    Would like to hold on referral to colorectal surgeon for now. Up to date on colonoscopy. Call with any concerns.        Other Visit Diagnoses    Hesitancy       Labs drawn today. Await results.   Relevant Orders   PSA       Follow up plan: Return in about 6 months (around 10/12/2021).

## 2021-04-11 NOTE — Assessment & Plan Note (Signed)
Under good control on current regimen. Continue current regimen. Continue to monitor. Call with any concerns. Refills given. Labs drawn today.   

## 2021-04-11 NOTE — Assessment & Plan Note (Signed)
Would like to hold on referral to colorectal surgeon for now. Up to date on colonoscopy. Call with any concerns.

## 2021-04-12 LAB — LIPID PANEL W/O CHOL/HDL RATIO
Cholesterol, Total: 136 mg/dL (ref 100–199)
HDL: 50 mg/dL (ref >39)
LDL Chol Calc (NIH): 62 mg/dL (ref 0–99)
Triglycerides: 140 mg/dL (ref 0–149)
VLDL Cholesterol Cal: 24 mg/dL (ref 5–40)

## 2021-04-12 LAB — COMPREHENSIVE METABOLIC PANEL
ALT: 16 IU/L (ref 0–44)
AST: 17 IU/L (ref 0–40)
Albumin/Globulin Ratio: 1.7 (ref 1.2–2.2)
Albumin: 4.5 g/dL (ref 3.8–4.8)
Alkaline Phosphatase: 49 IU/L (ref 44–121)
BUN/Creatinine Ratio: 14 (ref 10–24)
BUN: 15 mg/dL (ref 8–27)
Bilirubin Total: 0.7 mg/dL (ref 0.0–1.2)
CO2: 22 mmol/L (ref 20–29)
Calcium: 9.4 mg/dL (ref 8.6–10.2)
Chloride: 101 mmol/L (ref 96–106)
Creatinine, Ser: 1.05 mg/dL (ref 0.76–1.27)
Globulin, Total: 2.6 g/dL (ref 1.5–4.5)
Glucose: 146 mg/dL — ABNORMAL HIGH (ref 65–99)
Potassium: 4.7 mmol/L (ref 3.5–5.2)
Sodium: 138 mmol/L (ref 134–144)
Total Protein: 7.1 g/dL (ref 6.0–8.5)
eGFR: 77 mL/min/{1.73_m2} (ref >59)

## 2021-04-12 LAB — CBC WITH DIFFERENTIAL/PLATELET
Basophils Absolute: 0.1 10*3/uL (ref 0.0–0.2)
Basos: 1 %
EOS (ABSOLUTE): 0.3 10*3/uL (ref 0.0–0.4)
Eos: 3 %
Hematocrit: 42.2 % (ref 37.5–51.0)
Hemoglobin: 14.1 g/dL (ref 13.0–17.7)
Immature Grans (Abs): 0 10*3/uL (ref 0.0–0.1)
Immature Granulocytes: 0 %
Lymphocytes Absolute: 1.7 10*3/uL (ref 0.7–3.1)
Lymphs: 22 %
MCH: 29.9 pg (ref 26.6–33.0)
MCHC: 33.4 g/dL (ref 31.5–35.7)
MCV: 90 fL (ref 79–97)
Monocytes Absolute: 0.6 10*3/uL (ref 0.1–0.9)
Monocytes: 8 %
Neutrophils Absolute: 5.1 10*3/uL (ref 1.4–7.0)
Neutrophils: 66 %
Platelets: 310 10*3/uL (ref 150–450)
RBC: 4.71 x10E6/uL (ref 4.14–5.80)
RDW: 13.5 % (ref 11.6–15.4)
WBC: 7.7 10*3/uL (ref 3.4–10.8)

## 2021-04-12 LAB — TSH: TSH: 2.31 u[IU]/mL (ref 0.450–4.500)

## 2021-04-12 LAB — PSA: Prostate Specific Ag, Serum: 0.7 ng/mL (ref 0.0–4.0)

## 2021-04-19 ENCOUNTER — Telehealth: Payer: Self-pay

## 2021-04-19 NOTE — Telephone Encounter (Signed)
PA for Tadalifil initiated and submitted via Cover My Meds. Key: [id number]

## 2021-04-21 NOTE — Telephone Encounter (Signed)
PA for Tadalifil denied via CoverMyMeds. Insurance denial reason in provider folder for review.

## 2021-05-08 ENCOUNTER — Other Ambulatory Visit: Payer: Self-pay | Admitting: Family Medicine

## 2021-05-08 NOTE — Telephone Encounter (Signed)
Last RF 04/11/21 #90 1 RF

## 2021-06-19 IMAGING — DX DG CHEST 1V PORT
1 series · 1 of 1 positions shown · non-contrast
Comparison: Coronary CT 09/04/2019, chest radiograph 05/17/2010

CLINICAL DATA: [id number], worsening dyspnea

EXAM:
PORTABLE CHEST 1 VIEW

[chest ap]
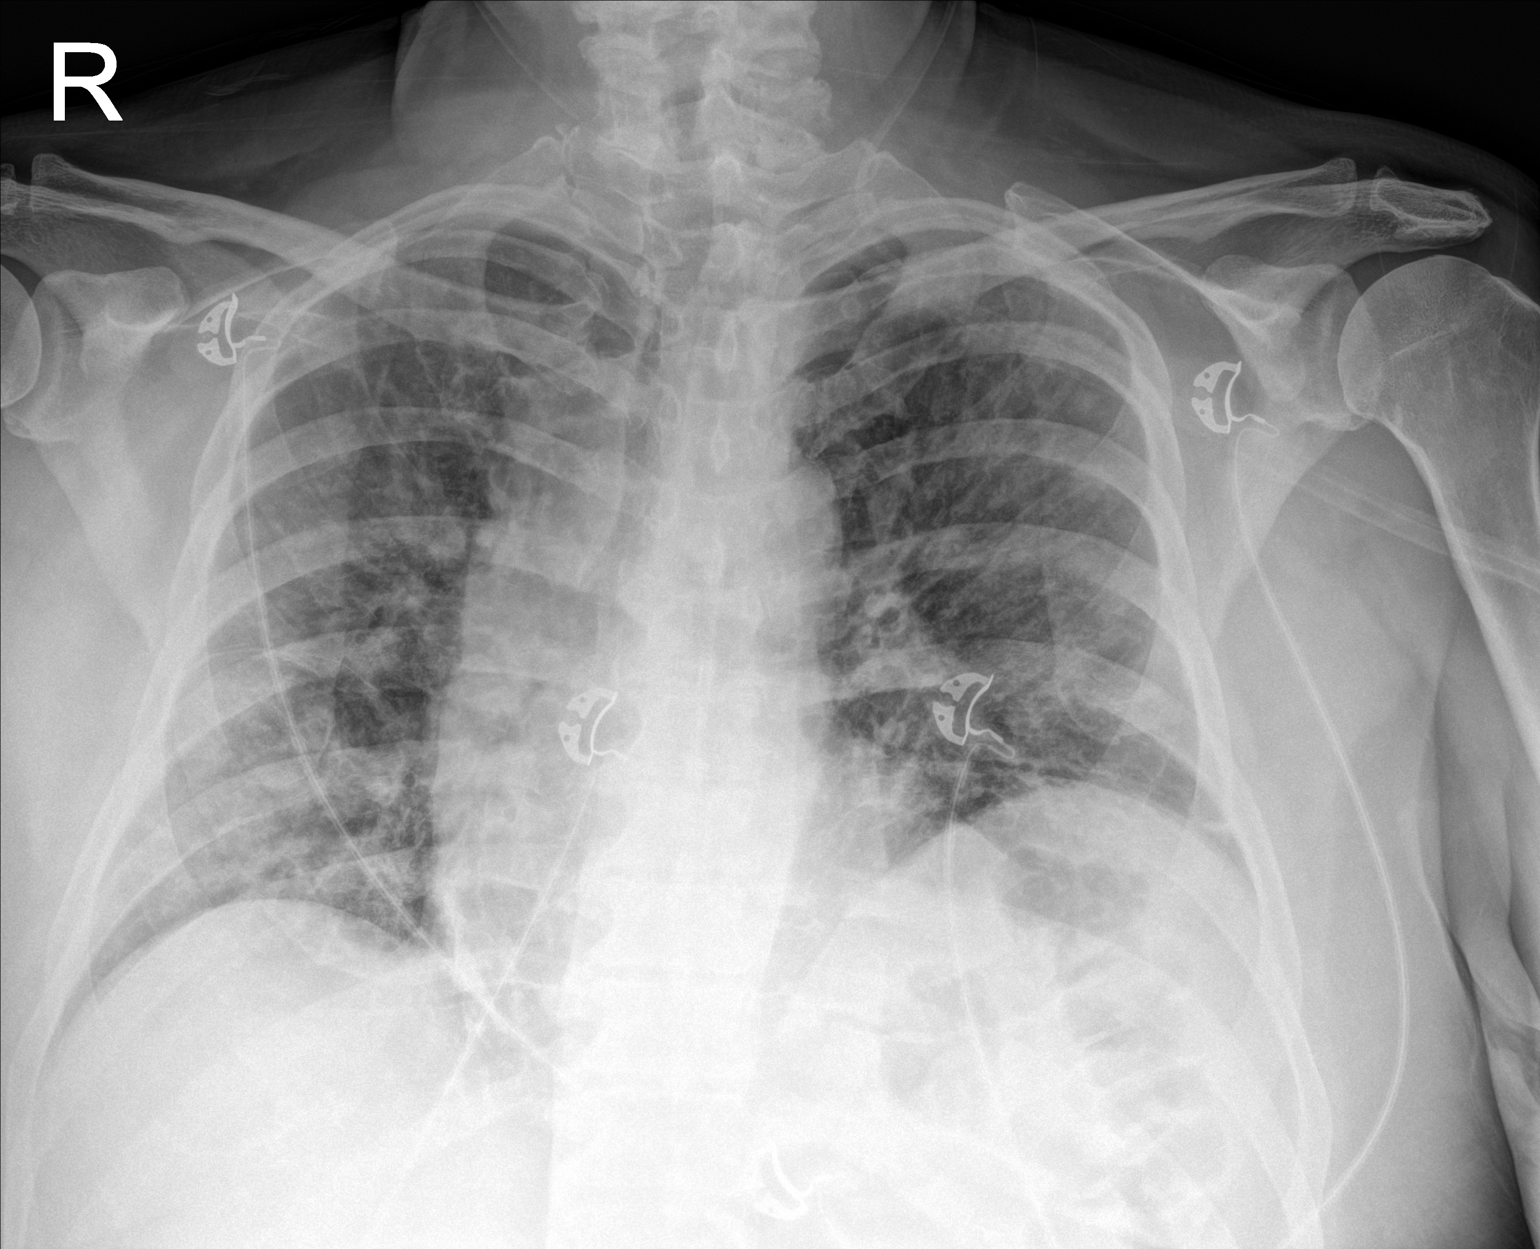

[1 of 1 positions shown; findings below may reference images not displayed]

FINDINGS: Multifocal areas of interstitial and airspace opacity throughout
both lungs with diminished volumes and streaky basilar areas of
subsegmental atelectasis. No pneumothorax or effusion.
Cardiomediastinal prominence likely accentuated by portable
technique. No acute osseous or soft tissue abnormality. Degenerative
changes are present in the imaged spine and shoulders.
IMPRESSION: Multifocal areas of interstitial and airspace opacity throughout
both lungs, compatible with multifocal pneumonia.

## 2021-06-23 IMAGING — CT CT ANGIO CHEST
1 of 4 series · 12 of 30 positions shown · IV contrast (omnipaque)
Comparison: Chest radiograph 10/13/2019.

CLINICAL DATA: PE suspected, intermediate prob, positive
d-dimer^75mL OMNIPAQUE IOHEXOL 350 MG/ML SOLNPE suspected,
intermediate prob, positive D-dimer COVID positive.

EXAM:
CT ANGIOGRAPHY CHEST WITH CONTRAST
TECHNIQUE: Multidetector CT imaging of the chest was performed using the
standard protocol during bolus administration of intravenous
contrast. Multiplanar CT image reconstructions and MIPs were
obtained to evaluate the vascular anatomy.
CONTRAST:  75mL OMNIPAQUE IOHEXOL 350 MG/ML SOLN

[Series 4: pe chest · axial · 0.81mm/px · z∈[+889,+1143]mm · 12 of 153 slices shown]
[im 13/153  lung]
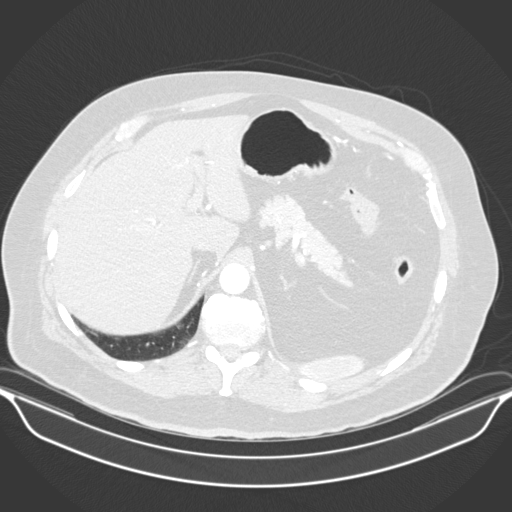
[im 26/153  mediastinal]
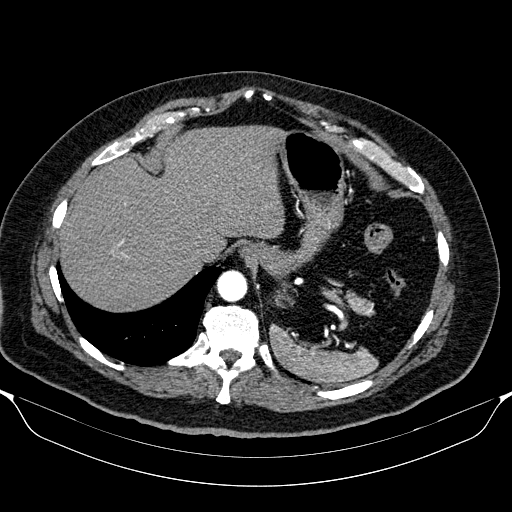
[im 39/153  lung]
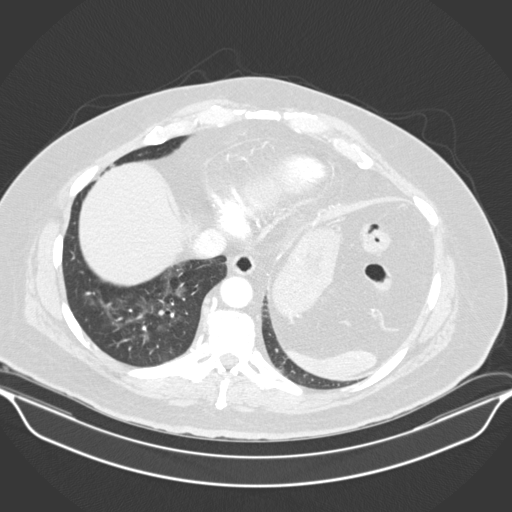
[im 51/153  mediastinal]
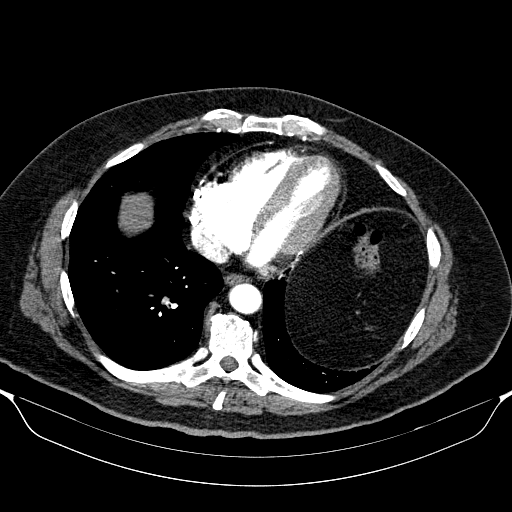
[im 64/153  lung]
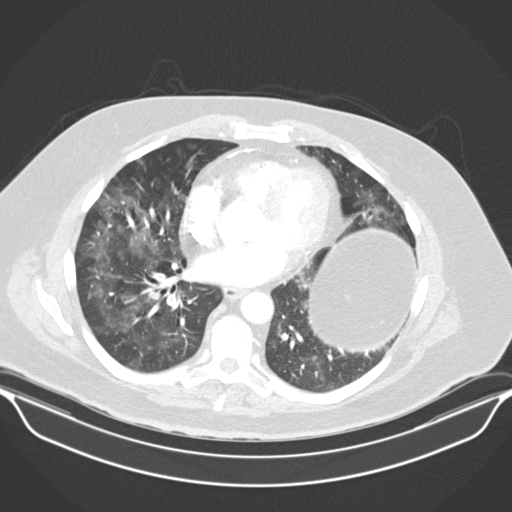
[im 72/153  mediastinal]
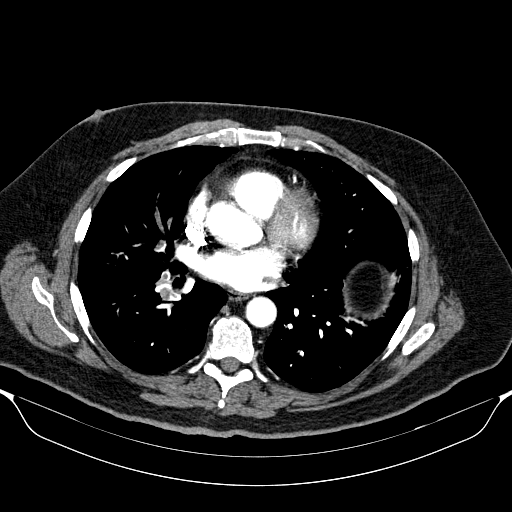
[im 77/153  lung]
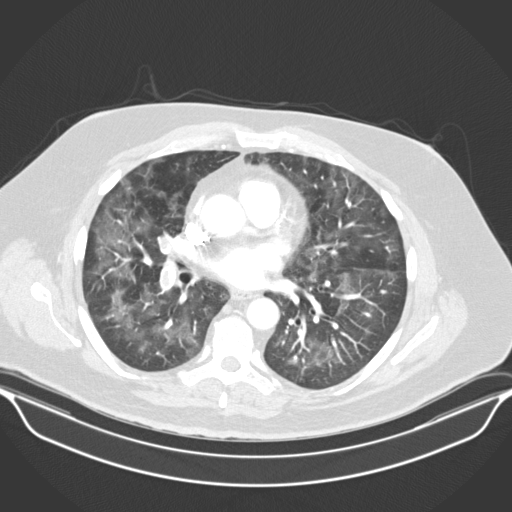
[im 89/153  mediastinal]
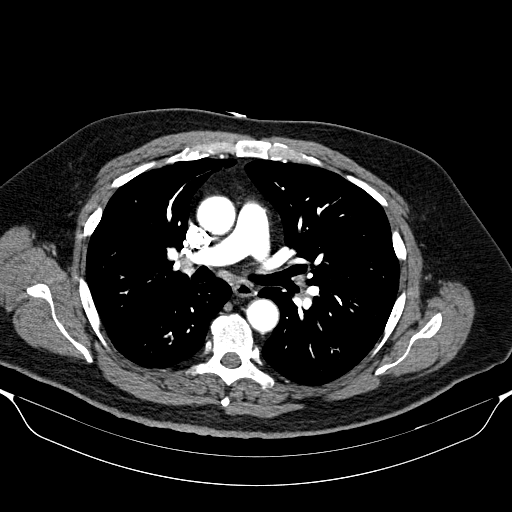
[im 102/153  lung]
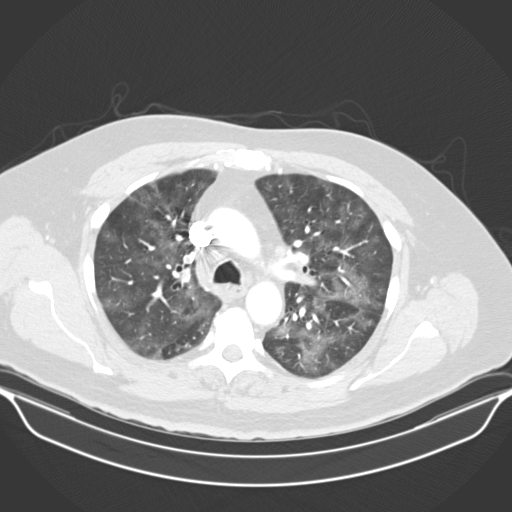
[im 115/153  mediastinal]
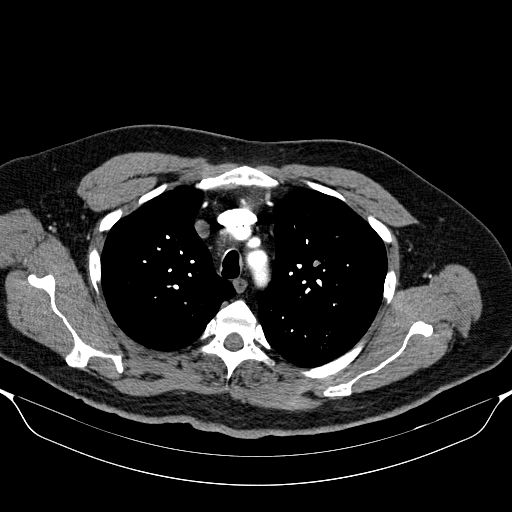
[im 127/153  lung]
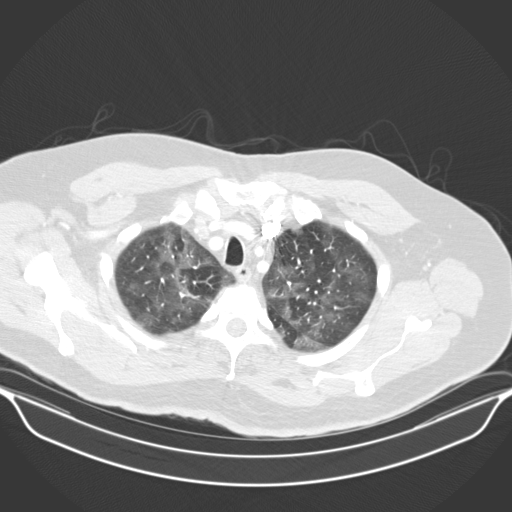
[im 140/153  mediastinal]
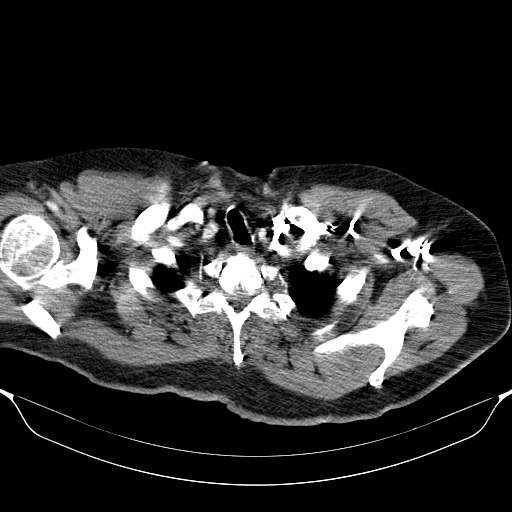

[12 of 30 positions shown; findings below may reference images not displayed]

FINDINGS: Cardiovascular: There are tubular filling defects within the
proximal LEFT lower lobe pulmonary arteries. Some defects are
occlusive while others are partially occlusive (images 76 through 92
of series 4). Tubular filling defect within the RIGHT middle lobe
pulmonary artery (image 81/4). Proximal filling defects within the
RIGHT upper lobe pulmonary artery additionally on image 55/4.

In the LEFT lung, small partially occlusive filling defect within
the LEFT upper lobe pulmonary artery. Small distal LEFT lower lobe
pulmonary artery filling defect.

Overall the a clot burden is moderate to severe. There is evidence
of RIGHT ventricular strain with the RIGHT ventricular diameter to
LEFT ventricular diameter ratio equal 1.2. ([DATE])

Mediastinum/Nodes: No axillary supraclavicular adenopathy. No
mediastinal hilar adenopathy.

Lungs/Pleura: No pulmonary infarction. There is diffuse ground-glass
densities in a perihilar distribution.

Upper Abdomen: Limited view of the liver, kidneys, pancreas are
unremarkable. Normal adrenal glands.

Musculoskeletal: No aggressive osseous lesion.

Review of the MIP images confirms the above findings.
IMPRESSION: 1. Positive for bilateral occlusive and nonocclusive acute pulmonary
thromboemboli with moderate to severe clot burden primarily on the
RIGHT.
2. Positive for acute PE with CT evidence of right heart strain
(RV/LV Ratio = 1.2) consistent with at least submassive
(intermediate risk) PE. The presence of right heart strain has been
associated with an increased risk of morbidity and mortality. Please
activate Code PE by paging [phone].
3. Diffuse ground-glass densities in a perihilar distribution is
consistent with viral pneumonia versus pulmonary edema.

Initial page to ordering physician unsuccessful. Findings conveyed
[REDACTED] 10/16/2019 at[DATE]. Nurse instructed to inform on-call
physician.

## 2021-08-06 ENCOUNTER — Telehealth: Payer: Self-pay | Admitting: Internal Medicine

## 2021-08-06 NOTE — Telephone Encounter (Signed)
Copied from [place] [id number]. Topic: Medicare AWV >> Aug 06, 2021  9:40 AM Cher Nakai R wrote: Reason for CRM:  Left message for patient to call back and schedule the Medicare Annual Wellness Visit (AWV) virtually or by telephone.  Last AWV  04/14/2020  Please schedule at anytime with [facility]-Nurse Health Advisor.  45 minute appointment  Any questions, please call me at [phone]

## 2021-08-08 NOTE — Progress Notes (Signed)
Cardiology Office Note  Date:  08/09/2021   ID:  Sanjeet, Lierman 1951/05/28, MRN [id number]  PCP:  Charlynne Cousins, MD   Chief Complaint  Patient presents with   12 month follow up     "Doing well." Medications reviewed by the patient verbally.     HPI:  Mr. Hunter Peters is a 70 year old gentleman with past medical history of Diabetes Family history cardiac disease Hyperlipidemia Nonsmoker Coronary calcium score 08/2019 of 326.  Calcifications noted in pericardium. 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, active, still working Denies any chest pain concerning for angina Plays golf, Weight running high  Stable lab work Total chol 136, LDL 62 A1C 7.2   EKG personally reviewed by myself on todays visit Shows normal sinus rhythm rate 71 bpm no significant ST-T wave changes  PMH:   Diabetes type 2  PSH:    Past Surgical History:  Procedure Laterality Date   COLONOSCOPY WITH PROPOFOL N/A 04/28/2020   Procedure: COLONOSCOPY WITH PROPOFOL;  Surgeon: Lin Landsman, MD;  Location: [facility];  Service: Gastroenterology;  Laterality: N/A;   EYE SURGERY  5 years ago   lens   Eyelid Surgery Bilateral    INCISION AND DRAINAGE ABSCESS N/A 11/06/2019   Procedure: INCISION AND DRAINAGE ABSCESS-perianal abscess;  Surgeon: Jules Husbands, MD;  Location: [facility];  Service: General;  Laterality: N/A;   INTRAOCULAR LENS INSERTION Left 7/11   KNEE ARTHROSCOPY Right    MEDIAL PARTIAL KNEE REPLACEMENT Right 09/22/2020   RECTAL EXAM UNDER ANESTHESIA N/A 11/06/2019   Procedure: RECTAL EXAM UNDER ANESTHESIA;  Surgeon: Jules Husbands, MD;  Location: [facility];  Service: General;  Laterality: N/A;    Current Outpatient Medications  Medication Sig Dispense Refill   Ascorbic Acid (VITAMIN C) 500 MG CAPS Take 500 mg by mouth daily.      cholecalciferol (VITAMIN D3) 25 MCG (1000 UT) tablet Take 2,000 Units by mouth  daily.     fluorometholone (FML) 0.1 % ophthalmic suspension Place 1 drop into the left eye at bedtime.     losartan (COZAAR) 25 MG tablet Take 1 tablet (25 mg total) by mouth daily. 90 tablet 1   metFORMIN (GLUCOPHAGE) 500 MG tablet Take 2 tablets (1,000 mg total) by mouth 2 (two) times daily with a meal. 360 tablet 1   psyllium (METAMUCIL) 58.6 % packet Take 1 packet by mouth daily.     rosuvastatin (CRESTOR) 10 MG tablet Take 1 tablet (10 mg total) by mouth daily. 90 tablet 1   tadalafil (CIALIS) 20 MG tablet Take 0.5-1 tablets (10-20 mg total) by mouth every other day as needed for erectile dysfunction. 5 tablet 11   clobetasol cream (TEMOVATE) 0.05 % Apply topically 2 (two) times daily as needed. (Patient not taking: No sig reported)     No current facility-administered medications for this visit.     Allergies:   Sulfonamide derivatives   Social History:  The patient  reports that he has been smoking cigars and cigarettes. He has never used smokeless tobacco. He reports current alcohol use. He reports that he does not use drugs.   Family History:   family history includes Alzheimer's disease in his mother; Hypertension in his father.    Review of Systems: Review of Systems  Constitutional: Negative.   HENT: Negative.    Respiratory: Negative.    Cardiovascular: Negative.   Gastrointestinal: Negative.  Musculoskeletal: Negative.   Neurological: Negative.   Psychiatric/Behavioral: Negative.    All other systems reviewed and are negative.   PHYSICAL EXAM: VS:  BP 128/60 (BP Location: Left Arm, Patient Position: Sitting, Cuff Size: Normal)   Pulse 71   Ht '5\' 10"'$  (1.778 m)   Wt 226 lb 4 oz (102.6 kg)   SpO2 98%   BMI 32.46 kg/m  , BMI Body mass index is 32.46 kg/m. Constitutional:  oriented to person, place, and time. No distress.  HENT:  Head: Grossly normal Eyes:  no discharge. No scleral icterus.  Neck: No JVD, no carotid bruits  Cardiovascular: Regular rate and  rhythm, no murmurs appreciated Pulmonary/Chest: Clear to auscultation bilaterally, no wheezes or rails Abdominal: Soft.  no distension.  no tenderness.  Musculoskeletal: Normal range of motion Neurological:  normal muscle tone. Coordination normal. No atrophy Skin: Skin warm and dry Psychiatric: normal affect, pleasant   Recent Labs: 04/11/2021: ALT 16; BUN 15; Creatinine, Ser 1.05; Hemoglobin 14.1; Platelets 310; Potassium 4.7; Sodium 138; TSH 2.310    Lipid Panel Lab Results  Component Value Date   CHOL 136 04/11/2021   HDL 50 04/11/2021   LDLCALC 62 04/11/2021   TRIG 140 04/11/2021      Wt Readings from Last 3 Encounters:  08/09/21 226 lb 4 oz (102.6 kg)  04/11/21 220 lb (99.8 kg)  10/18/20 215 lb (97.5 kg)     ASSESSMENT AND PLAN:  Problem List Items Addressed This Visit       Cardiology Problems   Hypercholesterolemia   Acute pulmonary embolism ([place])   Other Visit Diagnoses     Diabetes mellitus associated with hormonal etiology ([place])    -  Primary   Relevant Orders   EKG 12-Lead   Coronary artery calcification seen on CT scan       Relevant Orders   EKG 12-Lead   Chest pain, unspecified type         Coronary calcification Lifestyle modification recommended, cholesterol at goal, stressed importance of A1c 6 range  Diabetes type 2 We have encouraged continued exercise, careful diet management in an effort to lose weight. Continue low-dose losartan  Hyperlipidemia Continue Crestor 10 daily  Obesity We have encouraged continued exercise, careful diet management in an effort to lose weight.   Long discussion concerning risk stratification Non-smoker,  diabetes well controlled, cholesterol well controlled   Total encounter time more than 25 minutes  Greater than 50% was spent in counseling and coordination of care with the patient   Signed, [name], M.D., Ph.D. [place], [place]

## 2021-08-09 ENCOUNTER — Other Ambulatory Visit: Payer: Self-pay

## 2021-08-09 ENCOUNTER — Ambulatory Visit (INDEPENDENT_AMBULATORY_CARE_PROVIDER_SITE_OTHER): Payer: Medicare Other | Admitting: Cardiovascular Disease

## 2021-08-09 ENCOUNTER — Encounter: Payer: Self-pay | Admitting: Cardiovascular Disease

## 2021-08-09 VITALS — BP 128/60 | HR 71 | Ht 70.0 in | Wt 226.2 lb

## 2021-08-09 DIAGNOSIS — R079 Chest pain, unspecified: Secondary | ICD-10-CM

## 2021-08-09 DIAGNOSIS — E7800 Pure hypercholesterolemia, unspecified: Secondary | ICD-10-CM | POA: Diagnosis not present

## 2021-08-09 DIAGNOSIS — I2699 Other pulmonary embolism without acute cor pulmonale: Secondary | ICD-10-CM | POA: Diagnosis not present

## 2021-08-09 DIAGNOSIS — I2510 Atherosclerotic heart disease of native coronary artery without angina pectoris: Secondary | ICD-10-CM | POA: Diagnosis not present

## 2021-08-09 DIAGNOSIS — E1169 Type 2 diabetes mellitus with other specified complication: Secondary | ICD-10-CM | POA: Diagnosis not present

## 2021-08-09 NOTE — Patient Instructions (Addendum)
Medication Instructions:  No changes  If you need a refill on your cardiac medications before your next appointment, please call your pharmacy.   Lab work: No new labs needed  Testing/Procedures: No new testing needed  Follow-Up: At [facility], you and your health needs are our priority.  As part of our continuing mission to provide you with exceptional heart care, we have created designated Provider Care Teams.  These Care Teams include your primary Cardiologist (physician) and Advanced Practice Providers (APPs -  Physician Assistants and Nurse Practitioners) who all work together to provide you with the care you need, when you need it.  You will need a follow up appointment in 12 months  Providers on your designated Care Team:   Christopher Berge, NP Ryan Dunn, PA-C Jacquelyn Visser, PA-C Cadence Furth, PA-C  COVID-19 Vaccine Information can be found at: [email or web address] For questions related to vaccine distribution or appointments, please email [email or web address] or call [phone].    

## 2021-09-29 ENCOUNTER — Other Ambulatory Visit: Payer: Self-pay | Admitting: Family Medicine

## 2021-09-29 NOTE — Telephone Encounter (Signed)
Pt lives in [place] half the year and is currently there unitl May 2023; he will not be able to make an in person visit, only a Mychart visit.  He asked if it's possible to have another 90 day supply sent in or for clinical to contact him to discuss.

## 2021-09-29 NOTE — Telephone Encounter (Signed)
Requested medications are due for refill today.  yes  Requested medications are on the active medications list.  yes  Last refill. 04/11/2021  Future visit scheduled.   no  Notes to clinic.  Rx expired 08/09/2021.

## 2022-07-05 ENCOUNTER — Telehealth: Payer: Self-pay

## 2022-07-05 NOTE — Telephone Encounter (Signed)
error
